# Patient Record
Sex: Female | Born: 1960
Health system: Southern US, Community
[De-identification: ages and names within clinical notes are randomized; demographics above are authoritative.]

## PROBLEM LIST (undated history)

## (undated) DIAGNOSIS — Z1211 Encounter for screening for malignant neoplasm of colon: Secondary | ICD-10-CM

## (undated) DIAGNOSIS — R011 Cardiac murmur, unspecified: Secondary | ICD-10-CM

## (undated) DIAGNOSIS — N6091 Unspecified benign mammary dysplasia of right breast: Secondary | ICD-10-CM

## (undated) DIAGNOSIS — N951 Menopausal and female climacteric states: Secondary | ICD-10-CM

## (undated) DIAGNOSIS — Z1371 Encounter for nonprocreative screening for genetic disease carrier status: Secondary | ICD-10-CM

## (undated) DIAGNOSIS — Z8041 Family history of malignant neoplasm of ovary: Secondary | ICD-10-CM

## (undated) HISTORY — DX: Menopausal and female climacteric states: N95.1

## (undated) HISTORY — DX: Encounter for screening for malignant neoplasm of colon: Z12.11

## (undated) HISTORY — DX: Encounter for nonprocreative screening for genetic disease carrier status: Z13.71

## (undated) HISTORY — DX: Family history of malignant neoplasm of ovary: Z80.41

## (undated) HISTORY — DX: Cardiac murmur, unspecified: R01.1

## (undated) HISTORY — DX: Unspecified benign mammary dysplasia of right breast: N60.91

---

## 2005-03-15 ENCOUNTER — Ambulatory Visit: Payer: Self-pay | Admitting: Unknown Physician Specialty

## 2006-03-12 ENCOUNTER — Ambulatory Visit: Payer: Self-pay | Admitting: Unknown Physician Specialty

## 2007-03-19 ENCOUNTER — Ambulatory Visit: Payer: Self-pay | Admitting: Unknown Physician Specialty

## 2009-04-08 ENCOUNTER — Ambulatory Visit: Payer: Self-pay | Admitting: Unknown Physician Specialty

## 2010-04-18 ENCOUNTER — Ambulatory Visit: Payer: Self-pay | Admitting: Unknown Physician Specialty

## 2011-04-25 ENCOUNTER — Ambulatory Visit: Payer: Self-pay | Admitting: Unknown Physician Specialty

## 2013-06-30 ENCOUNTER — Ambulatory Visit: Payer: Self-pay | Admitting: Obstetrics and Gynecology

## 2014-08-04 ENCOUNTER — Ambulatory Visit: Payer: Self-pay

## 2014-08-04 LAB — COMPREHENSIVE METABOLIC PANEL
Albumin: 4 g/dL (ref 3.4–5.0)
Alkaline Phosphatase: 85 U/L
Anion Gap: 7 (ref 7–16)
BUN: 15 mg/dL (ref 7–18)
Bilirubin,Total: 0.5 mg/dL (ref 0.2–1.0)
CHLORIDE: 106 mmol/L (ref 98–107)
Calcium, Total: 9.2 mg/dL (ref 8.5–10.1)
Co2: 28 mmol/L (ref 21–32)
Creatinine: 0.69 mg/dL (ref 0.60–1.30)
EGFR (African American): >60
EGFR (Non-African Amer.): >60
Glucose: 90 mg/dL (ref 65–99)
OSMOLALITY: 282 (ref 275–301)
Potassium: 3.6 mmol/L (ref 3.5–5.1)
SGOT(AST): 20 U/L (ref 15–37)
SGPT (ALT): 22 U/L
Sodium: 141 mmol/L (ref 136–145)
Total Protein: 7.8 g/dL (ref 6.4–8.2)

## 2014-08-04 LAB — LIPID PANEL
Cholesterol: 211 mg/dL — ABNORMAL HIGH (ref 0–200)
HDL Cholesterol: 83 mg/dL — ABNORMAL HIGH (ref 40–60)
Ldl Cholesterol, Calc: 110 mg/dL — ABNORMAL HIGH (ref 0–100)
Triglycerides: 91 mg/dL (ref 0–200)
VLDL CHOLESTEROL, CALC: 18 mg/dL (ref 5–40)

## 2014-08-20 DIAGNOSIS — Z1371 Encounter for nonprocreative screening for genetic disease carrier status: Secondary | ICD-10-CM

## 2014-08-20 HISTORY — DX: Encounter for nonprocreative screening for genetic disease carrier status: Z13.71

## 2014-08-20 HISTORY — PX: BREAST BIOPSY: SHX20

## 2015-07-05 ENCOUNTER — Other Ambulatory Visit: Payer: Self-pay | Admitting: Obstetrics and Gynecology

## 2015-07-05 DIAGNOSIS — Z1231 Encounter for screening mammogram for malignant neoplasm of breast: Secondary | ICD-10-CM

## 2015-07-12 ENCOUNTER — Ambulatory Visit
Admission: RE | Admit: 2015-07-12 | Discharge: 2015-07-12 | Disposition: A | Discharge status: Home / Self Care | Payer: 59 | Source: Ambulatory Visit | Attending: Obstetrics and Gynecology | Admitting: Obstetrics and Gynecology

## 2015-07-12 ENCOUNTER — Other Ambulatory Visit: Payer: Self-pay | Admitting: Obstetrics and Gynecology

## 2015-07-12 DIAGNOSIS — Z1231 Encounter for screening mammogram for malignant neoplasm of breast: Secondary | ICD-10-CM | POA: Insufficient documentation

## 2015-09-07 MED FILL — VENLAFAXINE HCL 75 MG TAB: 75 | 30 days supply | Qty: 30 | Fill #0

## 2015-10-13 MED FILL — VENLAFAXINE HCL 75 MG TAB: 75 | 30 days supply | Qty: 30 | Fill #1

## 2015-10-19 DIAGNOSIS — H5213 Myopia, bilateral: Secondary | ICD-10-CM | POA: Diagnosis not present

## 2015-11-15 MED FILL — VENLAFAXINE HCL 75 MG TAB: 75 | 30 days supply | Qty: 30 | Fill #2

## 2015-12-14 MED FILL — VENLAFAXINE HCL 75 MG TAB: 75 | 30 days supply | Qty: 30 | Fill #3

## 2016-01-09 MED FILL — VENLAFAXINE HCL 75 MG TAB: 75 | 30 days supply | Qty: 30 | Fill #4

## 2016-02-01 MED FILL — VENLAFAXINE HCL 75 MG TAB: 75 | 30 days supply | Qty: 30 | Fill #5

## 2016-03-15 MED FILL — VENLAFAXINE HCL 75 MG TAB: 75 | 30 days supply | Qty: 30 | Fill #6

## 2016-04-09 DIAGNOSIS — H6121 Impacted cerumen, right ear: Secondary | ICD-10-CM | POA: Diagnosis not present

## 2016-04-09 DIAGNOSIS — H6980 Other specified disorders of Eustachian tube, unspecified ear: Secondary | ICD-10-CM | POA: Diagnosis not present

## 2016-04-09 DIAGNOSIS — J301 Allergic rhinitis due to pollen: Secondary | ICD-10-CM | POA: Diagnosis not present

## 2016-04-09 DIAGNOSIS — H903 Sensorineural hearing loss, bilateral: Secondary | ICD-10-CM | POA: Diagnosis not present

## 2016-04-09 MED FILL — VENLAFAXINE HCL 75 MG TAB: 75 | 30 days supply | Qty: 30 | Fill #0

## 2016-04-09 MED FILL — FLUTICASONE PROP 50 MCG SPR: 50 | 30 days supply | Qty: 16 | Fill #0

## 2016-05-01 DIAGNOSIS — M722 Plantar fascial fibromatosis: Secondary | ICD-10-CM | POA: Diagnosis not present

## 2016-05-01 MED FILL — MELOXICAM 15 MG TABLET: 15 | 30 days supply | Qty: 30 | Fill #0

## 2016-05-10 MED FILL — VENLAFAXINE HCL 75 MG TAB: 75 | 30 days supply | Qty: 30 | Fill #1

## 2016-05-10 MED FILL — FLUTICASONE PROP 50 MCG SPR: 50 | 30 days supply | Qty: 16 | Fill #1

## 2016-06-04 ENCOUNTER — Encounter: Payer: Self-pay | Admitting: Family Medicine

## 2016-06-04 ENCOUNTER — Ambulatory Visit (INDEPENDENT_AMBULATORY_CARE_PROVIDER_SITE_OTHER): Payer: 59 | Admitting: Family Medicine

## 2016-06-04 VITALS — BP 128/82 | HR 84 | Temp 98.2°F | Resp 16 | Ht 63.0 in | Wt 153.0 lb

## 2016-06-04 DIAGNOSIS — J309 Allergic rhinitis, unspecified: Secondary | ICD-10-CM | POA: Insufficient documentation

## 2016-06-04 DIAGNOSIS — H6691 Otitis media, unspecified, right ear: Secondary | ICD-10-CM | POA: Diagnosis not present

## 2016-06-04 DIAGNOSIS — H6980 Other specified disorders of Eustachian tube, unspecified ear: Secondary | ICD-10-CM | POA: Insufficient documentation

## 2016-06-04 MED ORDER — DOXYCYCLINE HYCLATE 100 MG PO TABS: 100.0000 mg | ORAL_TABLET | Freq: Two times a day (BID) | ORAL | 1 refills | Status: DC

## 2016-06-04 NOTE — Progress Notes (Signed)
       Patient: Catherine Ramos Female    DOB: 05-22-1961   55 y.o.   MRN: [id number] Visit Date: 06/04/2016  Today's Provider: Wilhemena Durie, MD   Chief Complaint  Patient presents with  . URI   Subjective:    URI   This is a new problem. The current episode started in the past 7 days (x 5 days). The problem has been gradually worsening. There has been no fever. Associated symptoms include coughing (dry), ear pain (right ear. Discharge), a plugged ear sensation, sinus pain ("eyes are puffy") and sneezing. Pertinent negatives include no abdominal pain, chest pain, congestion, diarrhea, dysuria, headaches, joint pain, nausea, neck pain, rash, rhinorrhea, sore throat, swollen glands, vomiting or wheezing. Treatments tried: Alka Seltzer Cold Plus. The treatment provided mild relief.       Allergies  Allergen Reactions  . Diphenhydramine     tachycardia     Current Outpatient Prescriptions:  .  CALCIUM CARBONATE-VIT D-MIN PO, Take by mouth., Disp: , Rfl:  .  fexofenadine (ALLEGRA ALLERGY) 180 MG tablet, Take by mouth., Disp: , Rfl:  .  venlafaxine (EFFEXOR) 75 MG tablet, , Disp: , Rfl: 6  Review of Systems  HENT: Positive for ear pain (right ear. Discharge) and sneezing. Negative for congestion, rhinorrhea and sore throat.   Eyes: Negative.   Respiratory: Positive for cough (dry). Negative for wheezing.   Cardiovascular: Negative for chest pain.  Gastrointestinal: Negative for abdominal pain, diarrhea, nausea and vomiting.  Endocrine: Negative.   Genitourinary: Negative for dysuria.  Musculoskeletal: Negative for joint pain and neck pain.  Skin: Negative for rash.  Allergic/Immunologic: Negative.   Neurological: Negative for headaches.  Psychiatric/Behavioral: Negative.     Social History  Substance Use Topics  . Smoking status: Never Smoker  . Smokeless tobacco: Never Used  . Alcohol use Yes     Comment: rare   Objective:   BP 128/82 (BP Location: Left Arm,  Patient Position: Sitting, Cuff Size: Normal)   Pulse 84   Temp 98.2 F (36.8 C) (Oral)   Resp 16   Ht 5\' 3"  (1.6 m)   Wt 153 lb (69.4 kg)   BMI 27.10 kg/m   Physical Exam  Constitutional: She appears well-developed and well-nourished.  HENT:  Head: Normocephalic and atraumatic.  Mouth/Throat: Oropharynx is clear and moist.  Right TM dull, erythematous, bulging. Left TM dull.  Neck: Neck supple. No thyromegaly present.  Cardiovascular: Normal rate and regular rhythm.   Pulmonary/Chest: Effort normal and breath sounds normal. No respiratory distress.  Abdominal: Soft.  Lymphadenopathy:    She has no cervical adenopathy.  Skin: Skin is warm and dry.  Psychiatric: She has a normal mood and affect. Her behavior is normal. Thought content normal.        Assessment & Plan:     1. Right otitis media, unspecified otitis media type  - doxycycline (VIBRA-TABS) 100 MG tablet; Take 1 tablet (100 mg total) by mouth 2 (two) times daily.  Dispense: 14 tablet; Refill: 1 2.URI    I have done the exam and reviewed the above chart and it is accurate to the best of my knowledge.  Patient seen and examined by Miguel Aschoff, MD, and note scribed by Renaldo Fiddler, CMA.  Richard Cranford Mon, MD  [facility]

## 2016-06-11 MED FILL — MELOXICAM 15 MG TABLET: 15 | 30 days supply | Qty: 30 | Fill #1

## 2016-06-11 MED FILL — FLUTICASONE PROP 50 MCG SPR: 50 | 30 days supply | Qty: 16 | Fill #2

## 2016-06-12 DIAGNOSIS — M722 Plantar fascial fibromatosis: Secondary | ICD-10-CM | POA: Diagnosis not present

## 2016-06-13 MED FILL — DOXYCYCLINE HYC 100 MG CAP: 100 | 7 days supply | Qty: 14 | Fill #0

## 2016-06-21 MED FILL — VENLAFAXINE HCL 75 MG TAB: 75 | 30 days supply | Qty: 30 | Fill #2

## 2016-06-25 ENCOUNTER — Other Ambulatory Visit: Payer: Self-pay | Admitting: Family Medicine

## 2016-06-25 DIAGNOSIS — H6691 Otitis media, unspecified, right ear: Secondary | ICD-10-CM

## 2016-06-25 NOTE — Telephone Encounter (Signed)
Pt contacted office for refill request on the following medications: doxycycline (VIBRA-TABS) 100 MG tablet Last written: 06/04/16 Pt stated that the medication had helped but she is still coughing and not feeling well and would like another round sent to [facility]. Please advise. Thanks TNP

## 2016-06-25 NOTE — Telephone Encounter (Signed)
Please review-aa 

## 2016-06-26 ENCOUNTER — Other Ambulatory Visit: Payer: Self-pay

## 2016-06-26 DIAGNOSIS — H6691 Otitis media, unspecified, right ear: Secondary | ICD-10-CM

## 2016-06-26 MED ORDER — DOXYCYCLINE HYCLATE 100 MG PO TABS: 100.0000 mg | ORAL_TABLET | Freq: Two times a day (BID) | ORAL | 0 refills | Status: DC

## 2016-06-26 MED FILL — DOXYCYCLINE HYCLATE 100 MG: 100 | 7 days supply | Qty: 14 | Fill #0

## 2016-06-26 NOTE — Telephone Encounter (Signed)
RX sent in, pt advised-aa

## 2016-06-26 NOTE — Telephone Encounter (Signed)
Ok,most likely viral ,however.

## 2016-07-04 MED FILL — FLUTICASONE PROP 50 MCG SPR: 50 | 30 days supply | Qty: 16 | Fill #3

## 2016-07-05 ENCOUNTER — Encounter: Payer: Self-pay | Admitting: *Deleted

## 2016-07-05 DIAGNOSIS — Z1151 Encounter for screening for human papillomavirus (HPV): Secondary | ICD-10-CM | POA: Diagnosis not present

## 2016-07-05 DIAGNOSIS — Z124 Encounter for screening for malignant neoplasm of cervix: Secondary | ICD-10-CM | POA: Diagnosis not present

## 2016-07-05 DIAGNOSIS — Z1239 Encounter for other screening for malignant neoplasm of breast: Secondary | ICD-10-CM | POA: Diagnosis not present

## 2016-07-05 DIAGNOSIS — Z8041 Family history of malignant neoplasm of ovary: Secondary | ICD-10-CM | POA: Diagnosis not present

## 2016-07-05 DIAGNOSIS — Z1211 Encounter for screening for malignant neoplasm of colon: Secondary | ICD-10-CM | POA: Diagnosis not present

## 2016-07-05 DIAGNOSIS — N951 Menopausal and female climacteric states: Secondary | ICD-10-CM | POA: Diagnosis not present

## 2016-07-05 DIAGNOSIS — Z01419 Encounter for gynecological examination (general) (routine) without abnormal findings: Secondary | ICD-10-CM | POA: Diagnosis not present

## 2016-07-05 DIAGNOSIS — Z0000 Encounter for general adult medical examination without abnormal findings: Secondary | ICD-10-CM | POA: Diagnosis not present

## 2016-07-05 LAB — HM PAP SMEAR

## 2016-07-10 ENCOUNTER — Ambulatory Visit (INDEPENDENT_AMBULATORY_CARE_PROVIDER_SITE_OTHER): Payer: 59 | Admitting: Physician Assistant

## 2016-07-10 ENCOUNTER — Encounter: Payer: Self-pay | Admitting: Physician Assistant

## 2016-07-10 VITALS — BP 122/76 | HR 78 | Temp 98.0°F | Resp 16 | Wt 152.0 lb

## 2016-07-10 DIAGNOSIS — H9211 Otorrhea, right ear: Secondary | ICD-10-CM

## 2016-07-10 NOTE — Progress Notes (Signed)
Patient: Catherine Ramos Female    DOB: November 04, 1960   55 y.o.   MRN: [id number] Visit Date: 07/10/2016  Today's Provider: Trinna Post, PA-C   Chief Complaint  Patient presents with  . Ear Drainage   Subjective:    HPI Patient is a 55 y/o female with history of allergies and right otitis media on 06/04/16 treated with two rounds of doxycycline who is here for ear drainage out of her right ear, yellowish in color. She reports that this has been occurring since 06/04/16 and got better with first round of doxy but then started draining again. So she was called in another antibiotics and it is still bothering her. She denies any ear pain, just that it feels full. She is taking Flonase. No fever, nausea, vomiting.    Allergies  Allergen Reactions  . Diphenhydramine     tachycardia     Current Outpatient Prescriptions:  .  CALCIUM CARBONATE-VIT D-MIN PO, Take by mouth., Disp: , Rfl:  .  fexofenadine (ALLEGRA ALLERGY) 180 MG tablet, Take by mouth., Disp: , Rfl:  .  fluticasone (FLONASE) 50 MCG/ACT nasal spray, , Disp: , Rfl: 12 .  venlafaxine (EFFEXOR) 75 MG tablet, , Disp: , Rfl: 6 .  doxycycline (VIBRA-TABS) 100 MG tablet, Take 1 tablet (100 mg total) by mouth 2 (two) times daily. (Patient not taking: Reported on 07/10/2016), Disp: 14 tablet, Rfl: 0  Review of Systems  Constitutional: Negative.   HENT: Positive for ear discharge.   Eyes: Negative.   Respiratory: Negative.   Cardiovascular: Negative.   Gastrointestinal: Negative.   Endocrine: Negative.   Genitourinary: Negative.   Musculoskeletal: Negative.   Skin: Negative.   Allergic/Immunologic: Negative.   Neurological: Negative.   Hematological: Negative.   Psychiatric/Behavioral: Negative.     Social History  Substance Use Topics  . Smoking status: Never Smoker  . Smokeless tobacco: Never Used  . Alcohol use Yes     Comment: rare   Objective:   BP 122/76 (BP Location: Left Arm, Patient Position:  Sitting, Cuff Size: Normal)   Pulse 78   Temp 98 F (36.7 C) (Oral)   Resp 16   Wt 152 lb (68.9 kg)   BMI 26.93 kg/m   Physical Exam  Constitutional: She appears well-developed and well-nourished. No distress.  HENT:  Right Ear: No drainage. Tympanic membrane is not injected, not perforated, not erythematous and not bulging. No hemotympanum. Decreased hearing is noted.  Left Ear: No drainage. Tympanic membrane is not injected, not perforated, not erythematous and not bulging. No hemotympanum. No decreased hearing is noted.  Mouth/Throat: Oropharynx is clear and moist. No oropharyngeal exudate.  There is a dry patch of skin in the right helix that is not weeping, crusted or bleeding.  Lymphadenopathy:    She has no cervical adenopathy.  Skin: She is not diaphoretic.        Assessment & Plan:      Problem List Items Addressed This Visit    None    Visit Diagnoses    Drainage from ear, right    -  Primary     Patient is a 55 year old female presenting with right ear drainage. I do not visualize any drainage in her ear canal today. Tympanic membrane is intact, not erythematous, not bulging. Advised patient to continue to use Flonase and Zyrtec. May use swimmers ear drops to dry ear. May use techniques in office to clear pressure in ear.  Counseled that fluid accumulation should resolve in time.   The entirety of the information documented in the History of Present Illness, Review of Systems and Physical Exam were personally obtained by me. Portions of this information were initially documented by [place] and reviewed by me for thoroughness and accuracy.   Patient Instructions  Ear Drainage Introduction Ear drainage means that ear wax, pus, blood, or other fluid comes out of the ear (discharge). Follow these instructions at home: Pay attention to any changes in your ear drainage. Take these actions to help with your condition:  Take over-the-counter and prescription  medicines only as told by your doctor.  Do not use cotton-tipped swabs in your ear. Do not put any other objects in your ear.  Do not swim until your doctor says it is okay.  Before you shower, cover a cotton ball with petroleum jelly and put that in your ear. This helps to keep water out of your ear.  Avoid being around smoke.  Wash your hands before and after you touch your ears.  Keep all follow-up visits as told by your doctor. This is important. Contact a doctor if:  You have more drainage.  You have ear pain.  You have a fever.  Your drainage is not getting better with treatment.  Your ear drainage is bloody, white, clear, or yellow.  Your ear is red or swollen. Get help right away if:  You have very bad ear pain.  You have a very bad headache.  You throw up (vomit).  You feel dizzy.  You have a seizure.  You have new hearing loss. This information is not intended to replace advice given to you by your health care provider. Make sure you discuss any questions you have with your health care provider. Document Released: 01/24/2010 Document Revised: 01/12/2016 Document Reviewed: 11/09/2014  2017 Elsevier   Return if symptoms worsen or fail to improve.        Trinna Post, PA-C  [facility]

## 2016-07-10 NOTE — Patient Instructions (Signed)
Ear Drainage Introduction Ear drainage means that ear wax, pus, blood, or other fluid comes out of the ear (discharge). Follow these instructions at home: Pay attention to any changes in your ear drainage. Take these actions to help with your condition:  Take over-the-counter and prescription medicines only as told by your doctor.  Do not use cotton-tipped swabs in your ear. Do not put any other objects in your ear.  Do not swim until your doctor says it is okay.  Before you shower, cover a cotton ball with petroleum jelly and put that in your ear. This helps to keep water out of your ear.  Avoid being around smoke.  Wash your hands before and after you touch your ears.  Keep all follow-up visits as told by your doctor. This is important. Contact a doctor if:  You have more drainage.  You have ear pain.  You have a fever.  Your drainage is not getting better with treatment.  Your ear drainage is bloody, white, clear, or yellow.  Your ear is red or swollen. Get help right away if:  You have very bad ear pain.  You have a very bad headache.  You throw up (vomit).  You feel dizzy.  You have a seizure.  You have new hearing loss. This information is not intended to replace advice given to you by your health care provider. Make sure you discuss any questions you have with your health care provider. Document Released: 01/24/2010 Document Revised: 01/12/2016 Document Reviewed: 11/09/2014  2017 Elsevier

## 2016-07-18 ENCOUNTER — Ambulatory Visit: Payer: Self-pay | Admitting: General Surgery

## 2016-07-19 ENCOUNTER — Other Ambulatory Visit: Payer: Self-pay | Admitting: Obstetrics and Gynecology

## 2016-07-19 DIAGNOSIS — Z1231 Encounter for screening mammogram for malignant neoplasm of breast: Secondary | ICD-10-CM

## 2016-07-30 ENCOUNTER — Telehealth: Payer: Self-pay | Admitting: Physician Assistant

## 2016-07-30 NOTE — Telephone Encounter (Signed)
Pt stated she had finish medication as advised and done OTC as directed. Pt stated her ear is back to how it was in October and would like to be advised what to try next. [place]. Pt stated to call on work # or if after 3 pm call cell#. Please advise. Thanks TNP

## 2016-07-31 NOTE — Telephone Encounter (Signed)
Pt called back to update her new work # and to request a call on her work #. Please advise. Thanks TNP

## 2016-07-31 NOTE — Telephone Encounter (Signed)
LMTCB  07/31/2016  Thanks,   -Mickel Baas

## 2016-07-31 NOTE — Telephone Encounter (Signed)
Please review and let Rhys Martini call her back :)-aa

## 2016-07-31 NOTE — Telephone Encounter (Signed)
Last time I saw patient her ear looked normal so I do not know what is bothering her or what else to suggest. She is already on allergy medications and flonase. She can have referral to ENT if she wishes.

## 2016-07-31 NOTE — Telephone Encounter (Signed)
Pt called back. Please advise.

## 2016-07-31 NOTE — Telephone Encounter (Signed)
FYI...   Pt advised.Marland KitchenMarland KitchenShe reports that she has already been to the ENT.  She has the same symptoms now as when she saw Dr. Rosanna Randy in October.  I advised her she would need to come in to see if there is an infection.  She agreed.  Appointment made for 08/02/2016 at 4pm.  Per pt's request.   Thanks,   -Mickel Baas

## 2016-08-02 ENCOUNTER — Encounter: Payer: Self-pay | Admitting: Physician Assistant

## 2016-08-02 ENCOUNTER — Ambulatory Visit (INDEPENDENT_AMBULATORY_CARE_PROVIDER_SITE_OTHER): Payer: 59 | Admitting: Physician Assistant

## 2016-08-02 VITALS — BP 118/76 | HR 88 | Temp 98.4°F | Resp 16 | Wt 152.0 lb

## 2016-08-02 DIAGNOSIS — H9211 Otorrhea, right ear: Secondary | ICD-10-CM | POA: Diagnosis not present

## 2016-08-02 MED ORDER — AMOXICILLIN 500 MG PO CAPS: 1000.0000 mg | ORAL_CAPSULE | Freq: Two times a day (BID) | ORAL | 0 refills | Status: AC

## 2016-08-02 NOTE — Progress Notes (Signed)
Patient: Catherine Ramos Female    DOB: March 03, 1961   55 y.o.   MRN: [id number] Visit Date: 08/02/2016  Today's Provider: Trinna Post, PA-C   Chief Complaint  Patient presents with  . Ear Drainage    Right ear.   Subjective:    Ear Drainage   There is pain in the right ear. This is a recurrent problem. The current episode started more than 1 month ago. The problem has been waxing and waning. There has been no fever. Associated symptoms include ear discharge. Pertinent negatives include no headaches, hearing loss, rhinorrhea or sore throat. She has tried antibiotics and ear drops for the symptoms. The treatment provided no relief. There is no history of hearing loss.   Patient is a 55 y/o female with history of allergies presenting today with right ear issues. Problems with her ear began two months ago when she felt she had hearing loss. She visited Dr. Richardson Landry at [facility] who cleaned out her ear and conducted hearing tests, which were normal.  Next, she presented to this office with right ear drainage and was prescribed two rounds of doxycycline which were ineffective. She was seen by myself three weeks ago for continued drainage, and directed on auto-insufflation techniques and to continue her Allegra and flonase nasal spray.  She presents again today for painless right ear drainage, which has worsened in the past few days. She has tried swimmers ear drops without relief. No ear pain or fullness, nausea or vomiting. She does not have any other upper respiratory symptoms.  Allergies  Allergen Reactions  . Diphenhydramine     tachycardia     Current Outpatient Prescriptions:  .  CALCIUM CARBONATE-VIT D-MIN PO, Take by mouth., Disp: , Rfl:  .  fexofenadine (ALLEGRA ALLERGY) 180 MG tablet, Take by mouth., Disp: , Rfl:  .  venlafaxine (EFFEXOR) 75 MG tablet, , Disp: , Rfl: 6 .  amoxicillin (AMOXIL) 500 MG capsule, Take 2 capsules (1,000 mg total) by mouth 2 (two) times  daily., Disp: 28 capsule, Rfl: 0 .  fluticasone (FLONASE) 50 MCG/ACT nasal spray, , Disp: , Rfl: 12  Review of Systems  HENT: Positive for congestion, ear discharge, sinus pressure and tinnitus. Negative for ear pain, hearing loss, nosebleeds, postnasal drip, rhinorrhea, sinus pain, sneezing and sore throat.   Eyes: Positive for discharge. Negative for photophobia, pain, redness, itching and visual disturbance.  Respiratory: Negative.   Cardiovascular: Negative.   Gastrointestinal: Negative.   Neurological: Negative for dizziness, light-headedness and headaches.    Social History  Substance Use Topics  . Smoking status: Never Smoker  . Smokeless tobacco: Never Used  . Alcohol use Yes     Comment: rare   Objective:   BP 118/76 (BP Location: Right Arm, Patient Position: Sitting, Cuff Size: Normal)   Pulse 88   Temp 98.4 F (36.9 C) (Oral)   Resp 16   Wt 152 lb (68.9 kg)   BMI 26.93 kg/m   Physical Exam  Constitutional: She appears well-developed and well-nourished. No distress.  HENT:  Right Ear: External ear normal. No drainage. Tympanic membrane is not injected, not erythematous, not retracted and not bulging. No middle ear effusion.  Left Ear: Tympanic membrane, external ear and ear canal normal. No drainage. Tympanic membrane is not injected, not erythematous, not retracted and not bulging.  No middle ear effusion.  There is a collection of white deposits on her right TM that obstruct visualization of the  ossicles.  Eyes: Conjunctivae are normal. Right eye exhibits no discharge. Left eye exhibits no discharge.  Lymphadenopathy:    She has no cervical adenopathy.  Skin: She is not diaphoretic.        Assessment & Plan:      Problem List Items Addressed This Visit    None    Visit Diagnoses    Drainage from right ear    -  Primary   Relevant Medications   amoxicillin (AMOXIL) 500 MG capsule     Patient is 55 y/o female presenting with persistent right ear  drainage. 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 Patient is reluctant to return to ENT, and wishes to try another round of antibiotics before doing so. I have advised her that I do not think they will be helpful to her, but if she is accepting of the risks, she may try them. If they fail, she should follow up with ENT. Patient agrees. Will treat with amoxicillin 1000 mg BID x 7 days.  Return if symptoms worsen or fail to improve.  The entirety of the information documented in the History of Present Illness, Review of Systems and Physical Exam were personally obtained by me. Portions of this information were initially documented by Ashley Royalty, CMA and reviewed by me for thoroughness and accuracy.   Patient Instructions  Ear Drainage Introduction Ear drainage means that ear wax, pus, blood, or other fluid comes out of the ear (discharge). Follow these instructions at home: Pay attention to any changes in your ear drainage. Take these actions to help with your condition:  Take over-the-counter and prescription medicines only as told by your doctor.  Do not use cotton-tipped swabs in your ear. Do not put any other objects in your ear.  Do not swim until your doctor says it is okay.  Before you shower, cover a cotton ball with petroleum jelly and put that in your ear. This helps to keep water out of your ear.  Avoid being around smoke.  Wash your hands before and after you touch your ears.  Keep all follow-up visits as told by your doctor. This is important. Contact a doctor if:  You have more drainage.  You have ear pain.  You have a fever.  Your drainage is not getting better with treatment.  Your ear drainage is bloody, white, clear, or yellow.  Your ear is red or swollen. Get help right away if:  You have very bad ear pain.  You have a very bad headache.  You throw up (vomit).  You feel dizzy.  You have a seizure.  You have new hearing  loss. This information is not intended to replace advice given to you by your health care provider. Make sure you discuss any questions you have with your health care provider. Document Released: 01/24/2010 Document Revised: 01/12/2016 Document Reviewed: 11/09/2014  2017 [place], PA-C  [facility]

## 2016-08-02 NOTE — Patient Instructions (Signed)
Ear Drainage Introduction Ear drainage means that ear wax, pus, blood, or other fluid comes out of the ear (discharge). Follow these instructions at home: Pay attention to any changes in your ear drainage. Take these actions to help with your condition:  Take over-the-counter and prescription medicines only as told by your doctor.  Do not use cotton-tipped swabs in your ear. Do not put any other objects in your ear.  Do not swim until your doctor says it is okay.  Before you shower, cover a cotton ball with petroleum jelly and put that in your ear. This helps to keep water out of your ear.  Avoid being around smoke.  Wash your hands before and after you touch your ears.  Keep all follow-up visits as told by your doctor. This is important. Contact a doctor if:  You have more drainage.  You have ear pain.  You have a fever.  Your drainage is not getting better with treatment.  Your ear drainage is bloody, white, clear, or yellow.  Your ear is red or swollen. Get help right away if:  You have very bad ear pain.  You have a very bad headache.  You throw up (vomit).  You feel dizzy.  You have a seizure.  You have new hearing loss. This information is not intended to replace advice given to you by your health care provider. Make sure you discuss any questions you have with your health care provider. Document Released: 01/24/2010 Document Revised: 01/12/2016 Document Reviewed: 11/09/2014  2017 Elsevier

## 2016-08-06 MED FILL — VENLAFAXINE HCL 75 MG TAB: 75 | 30 days supply | Qty: 30 | Fill #3

## 2016-08-06 MED FILL — FLUTICASONE PROP 50 MCG SPR: 50 | 30 days supply | Qty: 16 | Fill #4

## 2016-08-16 ENCOUNTER — Encounter: Payer: Self-pay | Admitting: *Deleted

## 2016-08-20 DIAGNOSIS — N6091 Unspecified benign mammary dysplasia of right breast: Secondary | ICD-10-CM

## 2016-08-20 HISTORY — DX: Unspecified benign mammary dysplasia of right breast: N60.91

## 2016-08-29 ENCOUNTER — Ambulatory Visit
Admission: RE | Admit: 2016-08-29 | Discharge: 2016-08-29 | Disposition: A | Discharge status: Home / Self Care | Payer: 59 | Source: Ambulatory Visit | Attending: Obstetrics and Gynecology | Admitting: Obstetrics and Gynecology

## 2016-08-29 DIAGNOSIS — Z1231 Encounter for screening mammogram for malignant neoplasm of breast: Secondary | ICD-10-CM | POA: Diagnosis not present

## 2016-09-03 ENCOUNTER — Other Ambulatory Visit: Payer: Self-pay | Admitting: Obstetrics and Gynecology

## 2016-09-03 DIAGNOSIS — R921 Mammographic calcification found on diagnostic imaging of breast: Secondary | ICD-10-CM

## 2016-09-03 DIAGNOSIS — R928 Other abnormal and inconclusive findings on diagnostic imaging of breast: Secondary | ICD-10-CM

## 2016-09-12 MED FILL — FLUTICASONE PROP 50 MCG SPR: 50 | 30 days supply | Qty: 16 | Fill #5

## 2016-09-13 MED FILL — VENLAFAXINE HCL 75 MG TAB: 75 | 30 days supply | Qty: 30 | Fill #0

## 2016-09-14 ENCOUNTER — Ambulatory Visit
Admission: RE | Admit: 2016-09-14 | Discharge: 2016-09-14 | Disposition: A | Discharge status: Home / Self Care | Payer: 59 | Source: Ambulatory Visit | Attending: Obstetrics and Gynecology | Admitting: Obstetrics and Gynecology

## 2016-09-14 DIAGNOSIS — R921 Mammographic calcification found on diagnostic imaging of breast: Secondary | ICD-10-CM | POA: Insufficient documentation

## 2016-09-14 DIAGNOSIS — R928 Other abnormal and inconclusive findings on diagnostic imaging of breast: Secondary | ICD-10-CM

## 2016-09-28 ENCOUNTER — Other Ambulatory Visit: Payer: Self-pay | Admitting: Obstetrics and Gynecology

## 2016-09-28 DIAGNOSIS — R921 Mammographic calcification found on diagnostic imaging of breast: Secondary | ICD-10-CM

## 2016-10-22 MED FILL — VENLAFAXINE HCL 75 MG TAB: 75 | 30 days supply | Qty: 30 | Fill #1

## 2016-10-23 DIAGNOSIS — D229 Melanocytic nevi, unspecified: Secondary | ICD-10-CM | POA: Diagnosis not present

## 2016-10-23 DIAGNOSIS — L570 Actinic keratosis: Secondary | ICD-10-CM | POA: Diagnosis not present

## 2016-10-23 DIAGNOSIS — D225 Melanocytic nevi of trunk: Secondary | ICD-10-CM | POA: Diagnosis not present

## 2016-10-23 DIAGNOSIS — L814 Other melanin hyperpigmentation: Secondary | ICD-10-CM | POA: Diagnosis not present

## 2016-10-23 DIAGNOSIS — D485 Neoplasm of uncertain behavior of skin: Secondary | ICD-10-CM | POA: Diagnosis not present

## 2016-11-20 MED FILL — VENLAFAXINE HCL 75 MG TAB: 75 | 30 days supply | Qty: 30 | Fill #2

## 2016-12-20 MED FILL — VENLAFAXINE HCL 75 MG TAB: 75 | 30 days supply | Qty: 30 | Fill #3

## 2017-01-21 ENCOUNTER — Other Ambulatory Visit: Payer: Self-pay | Admitting: Obstetrics and Gynecology

## 2017-01-21 MED FILL — VENLAFAXINE HCL 75 MG TAB: 75 | 30 days supply | Qty: 30 | Fill #0

## 2017-02-19 MED FILL — VENLAFAXINE HCL 75 MG TAB: 75 | 30 days supply | Qty: 30 | Fill #1

## 2017-03-18 ENCOUNTER — Ambulatory Visit
Admission: RE | Admit: 2017-03-18 | Discharge: 2017-03-18 | Disposition: A | Discharge status: Home / Self Care | Payer: 59 | Source: Ambulatory Visit | Attending: Obstetrics and Gynecology | Admitting: Obstetrics and Gynecology

## 2017-03-18 ENCOUNTER — Other Ambulatory Visit: Payer: Self-pay | Admitting: Obstetrics and Gynecology

## 2017-03-18 DIAGNOSIS — R921 Mammographic calcification found on diagnostic imaging of breast: Secondary | ICD-10-CM

## 2017-03-18 DIAGNOSIS — R928 Other abnormal and inconclusive findings on diagnostic imaging of breast: Secondary | ICD-10-CM

## 2017-03-19 ENCOUNTER — Telehealth: Payer: Self-pay | Admitting: Obstetrics and Gynecology

## 2017-03-19 MED FILL — VENLAFAXINE HCL 75 MG TAB: 75 | 30 days supply | Qty: 30 | Fill #2

## 2017-03-19 NOTE — Telephone Encounter (Signed)
LM for pt acknowledging Cat 4 mammo and bx recommendation. Asked pt to call me back if she has any questions/wants to discuss further.

## 2017-03-27 ENCOUNTER — Ambulatory Visit
Admission: RE | Admit: 2017-03-27 | Discharge: 2017-03-27 | Disposition: A | Discharge status: Home / Self Care | Payer: 59 | Source: Ambulatory Visit | Attending: Obstetrics and Gynecology | Admitting: Obstetrics and Gynecology

## 2017-03-27 DIAGNOSIS — R921 Mammographic calcification found on diagnostic imaging of breast: Secondary | ICD-10-CM | POA: Diagnosis not present

## 2017-03-27 DIAGNOSIS — N6091 Unspecified benign mammary dysplasia of right breast: Secondary | ICD-10-CM | POA: Diagnosis not present

## 2017-03-27 DIAGNOSIS — R928 Other abnormal and inconclusive findings on diagnostic imaging of breast: Secondary | ICD-10-CM

## 2017-03-27 DIAGNOSIS — N62 Hypertrophy of breast: Secondary | ICD-10-CM | POA: Diagnosis not present

## 2017-03-27 DIAGNOSIS — R920 Mammographic microcalcification found on diagnostic imaging of breast: Secondary | ICD-10-CM | POA: Diagnosis not present

## 2017-03-27 HISTORY — PX: BREAST BIOPSY: SHX20

## 2017-03-28 LAB — SURGICAL PATHOLOGY

## 2017-03-29 ENCOUNTER — Telehealth: Payer: Self-pay

## 2017-03-29 DIAGNOSIS — N6099 Unspecified benign mammary dysplasia of unspecified breast: Secondary | ICD-10-CM

## 2017-03-29 NOTE — Telephone Encounter (Signed)
Catherine Ramos from Dr. 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 New national recommendations are to watch the area but many patients still want to be referred to surgeons to have it removed. Catherine Ramos stated that you like to call your own patients with these results. Just wanted to let you know. They are notifying her that she does not have cancer and that you will follow up with her regarding next steps. If you have questions for Catherine Ramos she can be reached at [phone]. Thank you.

## 2017-03-29 NOTE — Telephone Encounter (Signed)
LM for pt with results. Offered gen surg ref vs watch and wait. Pt to notify me if wants ref.

## 2017-04-01 DIAGNOSIS — N6099 Unspecified benign mammary dysplasia of unspecified breast: Secondary | ICD-10-CM | POA: Insufficient documentation

## 2017-04-01 NOTE — Telephone Encounter (Signed)
Discussed bx results with pt and recommendation for surg ref for 2nd opinion. She will think about it and would be interested in doing appt in a few months. She prefers to call me back to schedule gen surg appt.

## 2017-04-01 NOTE — Telephone Encounter (Signed)
[place] again.

## 2017-04-18 MED FILL — VENLAFAXINE HCL 75 MG TAB: 75 | 30 days supply | Qty: 30 | Fill #3

## 2017-04-23 ENCOUNTER — Encounter: Payer: Self-pay | Admitting: Family Medicine

## 2017-05-20 ENCOUNTER — Other Ambulatory Visit: Payer: Self-pay | Admitting: Obstetrics and Gynecology

## 2017-05-20 MED FILL — VENLAFAXINE HCL 75 MG TAB: 75 | 30 days supply | Qty: 30 | Fill #0

## 2017-05-20 NOTE — Telephone Encounter (Signed)
Please advise for refill. Thank you.  

## 2017-06-18 ENCOUNTER — Other Ambulatory Visit: Payer: Self-pay | Admitting: Obstetrics and Gynecology

## 2017-06-18 MED FILL — VENLAFAXINE HCL 75 MG TAB: 75 | 30 days supply | Qty: 30 | Fill #0

## 2017-07-08 ENCOUNTER — Encounter: Payer: Self-pay | Admitting: Obstetrics and Gynecology

## 2017-07-08 ENCOUNTER — Ambulatory Visit (INDEPENDENT_AMBULATORY_CARE_PROVIDER_SITE_OTHER): Payer: 59 | Admitting: Obstetrics and Gynecology

## 2017-07-08 ENCOUNTER — Other Ambulatory Visit: Payer: Self-pay

## 2017-07-08 VITALS — BP 128/72 | HR 76 | Ht 63.0 in | Wt 149.0 lb

## 2017-07-08 DIAGNOSIS — Z1239 Encounter for other screening for malignant neoplasm of breast: Secondary | ICD-10-CM

## 2017-07-08 DIAGNOSIS — Z13220 Encounter for screening for lipoid disorders: Secondary | ICD-10-CM

## 2017-07-08 DIAGNOSIS — Z01419 Encounter for gynecological examination (general) (routine) without abnormal findings: Secondary | ICD-10-CM

## 2017-07-08 DIAGNOSIS — N951 Menopausal and female climacteric states: Secondary | ICD-10-CM

## 2017-07-08 DIAGNOSIS — Z1231 Encounter for screening mammogram for malignant neoplasm of breast: Secondary | ICD-10-CM | POA: Diagnosis not present

## 2017-07-08 DIAGNOSIS — Z1211 Encounter for screening for malignant neoplasm of colon: Secondary | ICD-10-CM

## 2017-07-08 DIAGNOSIS — Z8041 Family history of malignant neoplasm of ovary: Secondary | ICD-10-CM

## 2017-07-08 DIAGNOSIS — Z0000 Encounter for general adult medical examination without abnormal findings: Secondary | ICD-10-CM

## 2017-07-08 LAB — HEMOCCULT GUIAC POC 1CARD (OFFICE): FECAL OCCULT BLD: NEGATIVE

## 2017-07-08 MED ORDER — VENLAFAXINE HCL 75 MG PO TABS: 75.0000 mg | ORAL_TABLET | Freq: Every day | ORAL | 5 refills | Status: DC

## 2017-07-08 NOTE — Progress Notes (Signed)
PCP: Jerrol Banana., MD   Chief Complaint  Patient presents with  . Gynecologic Exam    No complaints    HPI:      Ms. Catherine Ramos is a 56 y.o. G0P0000 who LMP was No LMP recorded. Patient is postmenopausal., presents today for her annual examination.  Her menses are absent due to menopause.  She does not have intermenstrual bleeding.  She does have vasomotor sx and uses effexor episodically with sx relief. Winter months are better than hotter months.   Sex activity: single partner, contraception - post menopausal status. She does not have vaginal dryness.  Last Pap: July 05, 2016  Results were: no abnormalities /neg HPV DNA.  Hx of STDs: none  Last mammogram: March 18, 2017  Results were: cat 4 RT breast. Pt had bx that showed ATYPICAL LOBULAR HYPERPLASIA (ALH) ASSOCIATED WITH A FEW VERY SMALL CALCIFICATIONS, SEE COMMENT. COLUMNAR CELL CHANGE ASSOCIATED WITH CALCIFICATIONS. NEGATIVE FOR CARCINOMA. Pt offered 2nd opinion with general surgery but declined.   There is no FH of breast cancer. There is a FH of ovarian cancer in her PGM. Pt is MyRisk neg 2016. The patient does do self-breast exams.  Colonoscopy: Never. Pt interested in cologard.   Tobacco use: The patient denies current or previous tobacco use. Alcohol use: none Exercise: moderately active  She does get adequate calcium and Vitamin D in her diet.  Due for fasting labs.   Past Medical History:  Diagnosis Date  . 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 Vasomotor symptoms due to menopause     Past Surgical History:  Procedure Laterality Date  . BREAST BIOPSY Left   . BREAST BIOPSY Right 03/27/2017   stereo bx path pending    Family History  Problem Relation Age of Onset  . Ovarian cancer Paternal Grandmother 32    Social History   Socioeconomic History  . Marital status: Married    Spouse name: Not on file  . Number of children: Not on file  . Years of  education: Not on file  . Highest education level: Not on file  Social Needs  . Financial resource strain: Not on file  . Food insecurity - worry: Not on file  . Food insecurity - inability: Not on file  . Transportation needs - medical: Not on file  . Transportation needs - non-medical: Not on file  Occupational History  . Occupation: Dentist: [employer]  Tobacco Use  . Smoking status: Never Smoker  . Smokeless tobacco: Never Used  Substance and Sexual Activity  . Alcohol use: No    Frequency: Never    Comment: rare  . Drug use: No  . Sexual activity: Yes  Other Topics Concern  . Not on file  Social History Narrative  . Not on file    Current Meds  Medication Sig  . CALCIUM CARBONATE-VIT D-MIN PO Take by mouth.  . fexofenadine (ALLEGRA ALLERGY) 180 MG tablet Take by mouth.  . fluticasone (FLONASE) 50 MCG/ACT nasal spray   . venlafaxine (EFFEXOR) 75 MG tablet Take 1 tablet (75 mg total) daily by mouth.  . [DISCONTINUED] venlafaxine (EFFEXOR) 75 MG tablet TAKE 1 TABLET BY MOUTH ONCE DAILY      ROS:  Review of Systems  Constitutional: Negative for fatigue, fever and unexpected weight change.  Respiratory: Negative for cough, shortness of breath and wheezing.   Cardiovascular: Negative for chest pain, palpitations and leg  swelling.  Gastrointestinal: Negative for blood in stool, constipation, diarrhea, nausea and vomiting.  Endocrine: Negative for cold intolerance, heat intolerance and polyuria.  Genitourinary: Negative for dyspareunia, dysuria, flank pain, frequency, genital sores, hematuria, menstrual problem, pelvic pain, urgency, vaginal bleeding, vaginal discharge and vaginal pain.  Musculoskeletal: Negative for back pain, joint swelling and myalgias.  Skin: Negative for rash.  Neurological: Negative for dizziness, syncope, light-headedness, numbness and headaches.  Hematological: Negative for adenopathy.  Psychiatric/Behavioral: Negative for  agitation, confusion, sleep disturbance and suicidal ideas. The patient is not nervous/anxious.      Objective: BP 128/72 (BP Location: Left Arm, Patient Position: Sitting, Cuff Size: Normal)   Pulse 76   Ht 5\' 3"  (1.6 m)   Wt 149 lb (67.6 kg)   BMI 26.39 kg/m    Physical Exam  Constitutional: She is oriented to person, place, and time. She appears well-developed and well-nourished.  Genitourinary: Rectum normal, vagina normal and uterus normal. There is no rash or tenderness on the right labia. There is no rash or tenderness on the left labia. No erythema or tenderness in the vagina. No vaginal discharge found. Right adnexum does not display mass and does not display tenderness. Left adnexum does not display mass and does not display tenderness. Cervix does not exhibit motion tenderness or polyp. Uterus is not enlarged or tender. Rectal exam shows no fissure, no mass, no tenderness and guaiac negative stool.  Neck: Normal range of motion. No thyromegaly present.  Cardiovascular: Normal rate, regular rhythm and normal heart sounds.  No murmur heard. Pulmonary/Chest: Effort normal and breath sounds normal. Right breast exhibits no mass, no nipple discharge, no skin change and no tenderness. Left breast exhibits no mass, no nipple discharge, no skin change and no tenderness.  Abdominal: Soft. There is no tenderness. There is no guarding.  Musculoskeletal: Normal range of motion.  Neurological: She is alert and oriented to person, place, and time. No cranial nerve deficit.  Psychiatric: She has a normal mood and affect. Her behavior is normal.  Vitals reviewed.   Results: Results for orders placed or performed in visit on 07/08/17 (from the past 24 hour(s))  POCT Occult Blood Stool     Status: Normal   Collection Time: 07/08/17  3:14 PM  Result Value Ref Range   Fecal Occult Blood, POC Negative Negative   Card #1 Date     Card #2 Fecal Occult Blod, POC     Card #2 Date     Card #3  Fecal Occult Blood, POC     Card #3 Date      Assessment/Plan:  Encounter for annual routine gynecological examination  Screening for breast cancer - Pt declines gen surg 2nd opinion re: breast bx. Repeat mammo due 7/19. - Plan: MM DIGITAL SCREENING BILATERAL  Screening for colon cancer - Neg FOBT. Declines colonoscopy. Pt would like to look into cologard. Will f/u for ref after contacting insurance co. - Plan: POCT Occult Blood Stool  Blood tests for routine general physical examination - Plan: Comprehensive metabolic panel, Lipid panel  Screening cholesterol level - Plan: Lipid panel  Vasomotor symptoms due to menopause - Rx RF effexor. Pt takes prn sx.  - Plan: venlafaxine (EFFEXOR) 75 MG tablet  Family history of ovarian cancer - Pt is MyRisk neg. NO further screening/testing indicated.   Meds ordered this encounter  Medications  . venlafaxine (EFFEXOR) 75 MG tablet    Sig: Take 1 tablet (75 mg total) daily by mouth.  Dispense:  30 tablet    Refill:  5          GYN counsel breast self exam, mammography screening, menopause, adequate intake of calcium and vitamin D, diet and exercise    F/U  Return in about 1 year (around 07/08/2018).  Alicia B. Copland, PA-C 07/08/2017 3:18 PM

## 2017-07-08 NOTE — Patient Instructions (Signed)
I value your feedback and entrusting us with your care. If you get a [facility] patient survey, I would appreciate you taking the time to let us know about your experience today. Thank you! 

## 2017-07-18 MED FILL — VENLAFAXINE HCL 75 MG TAB: 75 | 30 days supply | Qty: 30 | Fill #0

## 2017-08-21 MED FILL — VENLAFAXINE HCL 75 MG TAB: 75 | 30 days supply | Qty: 30 | Fill #1

## 2017-09-23 MED FILL — VENLAFAXINE HCL 75 MG TAB: 75 | 30 days supply | Qty: 30 | Fill #2

## 2017-10-22 MED FILL — VENLAFAXINE HCL 75 MG TAB: 75 | 30 days supply | Qty: 30 | Fill #3

## 2017-11-21 MED FILL — VENLAFAXINE HCL 75 MG TAB: 75 | 30 days supply | Qty: 30 | Fill #4

## 2017-12-19 MED FILL — VENLAFAXINE HCL 75 MG TAB: 75 | 30 days supply | Qty: 30 | Fill #5

## 2017-12-23 ENCOUNTER — Encounter: Payer: Self-pay | Admitting: Physician Assistant

## 2017-12-23 ENCOUNTER — Ambulatory Visit: Payer: 59 | Admitting: Physician Assistant

## 2017-12-23 VITALS — BP 122/74 | HR 76 | Temp 98.3°F | Resp 16 | Wt 154.0 lb

## 2017-12-23 DIAGNOSIS — H60501 Unspecified acute noninfective otitis externa, right ear: Secondary | ICD-10-CM

## 2017-12-23 MED ORDER — CIPROFLOXACIN-DEXAMETHASONE 0.3-0.1 % OT SUSP: 4.0000 [drp] | Freq: Two times a day (BID) | OTIC | 0 refills | Status: DC

## 2017-12-23 NOTE — Progress Notes (Signed)
Patient: Catherine Ramos Female    DOB: February 01, 1961   57 y.o.   MRN: [id number] Visit Date: 12/24/2017  Today's Provider: Trinna Post, PA-C   Chief Complaint  Patient presents with  . Ear Pain    Started about two days ago.    Subjective:    Otalgia   There is pain in the right ear. This is a new problem. The current episode started in the past 7 days. There has been no fever. Associated symptoms include coughing, ear discharge and hearing loss. Pertinent negatives include no abdominal pain, diarrhea, headaches, neck pain, rash, rhinorrhea, sore throat or vomiting. Treatments tried: Allegra, Flonase. The treatment provided no relief.       Allergies  Allergen Reactions  . Diphenhydramine     tachycardia     Current Outpatient Medications:  .  CALCIUM CARBONATE-VIT D-MIN PO, Take by mouth., Disp: , Rfl:  .  fexofenadine (ALLEGRA ALLERGY) 180 MG tablet, Take by mouth., Disp: , Rfl:  .  fluticasone (FLONASE) 50 MCG/ACT nasal spray, , Disp: , Rfl: 12 .  venlafaxine (EFFEXOR) 75 MG tablet, Take 1 tablet (75 mg total) daily by mouth., Disp: 30 tablet, Rfl: 5 .  ciprofloxacin-dexamethasone (CIPRODEX) OTIC suspension, Place 4 drops into the right ear 2 (two) times daily., Disp: 7.5 mL, Rfl: 0  Review of Systems  Constitutional: Negative.   HENT: Positive for ear discharge, ear pain, hearing loss and tinnitus. Negative for congestion, dental problem, drooling, mouth sores, nosebleeds, postnasal drip, rhinorrhea, sinus pressure, sinus pain, sneezing, sore throat, trouble swallowing and voice change.   Respiratory: Positive for cough. Negative for apnea, choking, chest tightness, shortness of breath, wheezing and stridor.   Gastrointestinal: Negative.  Negative for abdominal pain, diarrhea and vomiting.  Musculoskeletal: Negative for neck pain.  Skin: Negative for rash.  Neurological: Negative for dizziness, light-headedness and headaches.    Social History   Tobacco Use    . Smoking status: Never Smoker  . Smokeless tobacco: Never Used  Substance Use Topics  . Alcohol use: No    Frequency: Never    Comment: rare   Objective:   BP 122/74 (BP Location: Right Arm, Patient Position: Sitting, Cuff Size: Normal)   Pulse 76   Temp 98.3 F (36.8 C) (Oral)   Resp 16   Wt 154 lb (69.9 kg)   BMI 27.28 kg/m  Vitals:   12/23/17 1452  BP: 122/74  Pulse: 76  Resp: 16  Temp: 98.3 F (36.8 C)  TempSrc: Oral  Weight: 154 lb (69.9 kg)     Physical Exam  Constitutional: She is oriented to person, place, and time. She appears well-developed and well-nourished.  HENT:  Right Ear: External ear normal. There is swelling. No tenderness.  Left Ear: External ear normal. No swelling or tenderness.  Nose: Nose normal.  Mouth/Throat: Oropharynx is clear and moist. No oropharyngeal exudate.  Significant auditory canal swelling.   Pulmonary/Chest: Effort normal and breath sounds normal.  Neurological: She is alert and oriented to person, place, and time.  Skin: Skin is warm and dry.  Psychiatric: She has a normal mood and affect. Her behavior is normal.        Assessment & Plan:     1. Acute otitis externa of right ear, unspecified type  - ciprofloxacin-dexamethasone (CIPRODEX) OTIC suspension; Place 4 drops into the right ear 2 (two) times daily.  Dispense: 7.5 mL; Refill: 0  Return if symptoms worsen or fail  to improve.  The entirety of the information documented in the History of Present Illness, Review of Systems and Physical Exam were personally obtained by me. Portions of this information were initially documented by Ashley Royalty, CMA and reviewed by me for thoroughness and accuracy.        Trinna Post, PA-C  [facility]

## 2017-12-23 NOTE — Patient Instructions (Signed)
Otitis Externa Otitis externa is an infection of the outer ear canal. The outer ear canal is the area between the outside of the ear and the eardrum. Otitis externa is sometimes called "swimmer's ear." Follow these instructions at home:  If you were given antibiotic ear drops, use them as told by your doctor. Do not stop using them even if your condition gets better.  Take over-the-counter and prescription medicines only as told by your doctor.  Keep all follow-up visits as told by your doctor. This is important. How is this prevented?  Keep your ear dry. Use the corner of a towel to dry your ear after you swim or bathe.  Try not to scratch or put things in your ear. Doing these things makes it easier for germs to grow in your ear.  Avoid swimming in lakes, dirty water, or pools that may not have the right amount of a chemical called chlorine.  Consider making ear drops and putting 3 or 4 drops in each ear after you swim. Ask your doctor about how you can make ear drops. Contact a doctor if:  You have a fever.  After 3 days your ear is still red, swollen, or painful.  After 3 days you still have pus coming from your ear.  Your redness, swelling, or pain gets worse.  You have a really bad headache.  You have redness, swelling, pain, or tenderness behind your ear. This information is not intended to replace advice given to you by your health care provider. Make sure you discuss any questions you have with your health care provider. Document Released: 01/23/2008 Document Revised: 09/01/2015 Document Reviewed: 05/16/2015 Elsevier Interactive Patient Education  2018 Elsevier Inc.  

## 2017-12-25 ENCOUNTER — Telehealth: Payer: Self-pay | Admitting: Family Medicine

## 2017-12-25 DIAGNOSIS — H6091 Unspecified otitis externa, right ear: Secondary | ICD-10-CM

## 2017-12-25 MED ORDER — NEOMYCIN-POLYMYXIN-HC 3.5-10000-1 OT SOLN: 3.0000 [drp] | Freq: Four times a day (QID) | OTIC | 0 refills | Status: DC

## 2017-12-25 MED FILL — NEO/POLYMYXIN/HC EAR SOLN: 3.5-10000-1 | 16 days supply | Qty: 10 | Fill #0

## 2017-12-25 NOTE — Telephone Encounter (Signed)
LMTCB on her work number, no answer on her Home number.   Thanks,   -Mickel Baas

## 2017-12-25 NOTE — Telephone Encounter (Signed)
It's only been one day, it will likely take a few days for it to work. Ciprodex has a steroid called Dexamethasone which is more powerful than hydrocortisone, but if she wants these drops I'll send them in. Additionally, this patient should be reminded that my personal e-mail is not an appropriate method by which to contact me and all patient care inquiries should conducted through official channels which include MyChart and calling the clinic.

## 2017-12-25 NOTE — Telephone Encounter (Signed)
Pt was in Monday for ear swelling and drainage in her right ear.  She was prescribed drops but they havent helped.  She said she is actually having pain in the rt ear now.  She wants to know if you prescribe Neomycin-polymyxin HC ear drop.  She uses [facility] outpatient [facility] today    Pt's call back today is [phone]  Thanks teri

## 2018-01-01 ENCOUNTER — Telehealth: Payer: Self-pay

## 2018-01-01 NOTE — Telephone Encounter (Signed)
Patient called to let Fabio Bering know that her ear is not feeling any better. She reports that she still has a fullness sensation, and feels that she needs an abx to "clear this up". [facility]. Please advise. Thanks!

## 2018-01-01 NOTE — Telephone Encounter (Signed)
If drops have not worked, she should be further evaluated, preferably by ENT since she has established there and she is currently symptomatic, they can better see what's going on. There is not a large role for oral antibiotics in outer ear infections unless a patient has diabetes or spreading infection and do not think prescription of this is appropriate without further eval.

## 2018-01-02 NOTE — Telephone Encounter (Signed)
Pt advised.  She will call ENT.   Thanks,   -Mickel Baas

## 2018-01-20 ENCOUNTER — Other Ambulatory Visit: Payer: Self-pay | Admitting: Obstetrics and Gynecology

## 2018-01-20 DIAGNOSIS — N951 Menopausal and female climacteric states: Secondary | ICD-10-CM

## 2018-01-20 MED FILL — VENLAFAXINE HCL 75 MG TAB: 75 | 30 days supply | Qty: 30 | Fill #0

## 2018-01-20 NOTE — Telephone Encounter (Signed)
Please advise thank you

## 2018-02-18 MED FILL — VENLAFAXINE HCL 75 MG TAB: 75 | 30 days supply | Qty: 30 | Fill #1

## 2018-03-20 MED FILL — VENLAFAXINE HCL 75 MG TAB: 75 | 30 days supply | Qty: 30 | Fill #2

## 2018-04-22 MED FILL — VENLAFAXINE HCL 75 MG TAB: 75 | 30 days supply | Qty: 30 | Fill #3

## 2018-05-21 MED FILL — VENLAFAXINE HCL 75 MG TAB: 75 | 30 days supply | Qty: 30 | Fill #4

## 2018-06-19 MED FILL — VENLAFAXINE HCL 75 MG TAB: 75 | 30 days supply | Qty: 30 | Fill #5

## 2018-07-09 ENCOUNTER — Encounter: Payer: Self-pay | Admitting: Obstetrics and Gynecology

## 2018-07-09 ENCOUNTER — Ambulatory Visit (INDEPENDENT_AMBULATORY_CARE_PROVIDER_SITE_OTHER): Payer: 59 | Admitting: Obstetrics and Gynecology

## 2018-07-09 VITALS — BP 144/80 | HR 76 | Ht 63.0 in | Wt 148.0 lb

## 2018-07-09 DIAGNOSIS — N6099 Unspecified benign mammary dysplasia of unspecified breast: Secondary | ICD-10-CM

## 2018-07-09 DIAGNOSIS — R011 Cardiac murmur, unspecified: Secondary | ICD-10-CM

## 2018-07-09 DIAGNOSIS — Z01419 Encounter for gynecological examination (general) (routine) without abnormal findings: Secondary | ICD-10-CM | POA: Diagnosis not present

## 2018-07-09 DIAGNOSIS — N951 Menopausal and female climacteric states: Secondary | ICD-10-CM

## 2018-07-09 DIAGNOSIS — R928 Other abnormal and inconclusive findings on diagnostic imaging of breast: Secondary | ICD-10-CM

## 2018-07-09 DIAGNOSIS — Z8041 Family history of malignant neoplasm of ovary: Secondary | ICD-10-CM

## 2018-07-09 DIAGNOSIS — Z1211 Encounter for screening for malignant neoplasm of colon: Secondary | ICD-10-CM

## 2018-07-09 DIAGNOSIS — Z1239 Encounter for other screening for malignant neoplasm of breast: Secondary | ICD-10-CM

## 2018-07-09 MED ORDER — VENLAFAXINE HCL 75 MG PO TABS: 75.0000 mg | ORAL_TABLET | Freq: Every day | ORAL | 2 refills | Status: DC

## 2018-07-09 NOTE — Progress Notes (Signed)
PCP: Jerrol Banana., MD   Chief Complaint  Patient presents with  . Gynecologic Exam    HPI:      Ms. Catherine Ramos is a 57 y.o. G0P0000 who LMP was No LMP recorded. Patient is postmenopausal., presents today for her annual examination.  Her menses are absent due to menopause.  She does not have intermenstrual bleeding.  She does have vasomotor sx and uses effexor episodically with sx relief. Winter months are better than hotter months.   Sex activity: single partner, contraception - post menopausal status. She does have vaginal dryness, improved with lubricants.   Last Pap: July 05, 2016  Results were: no abnormalities /neg HPV DNA.  Hx of STDs: none  Last mammogram: March 18, 2017  Results were: cat 4 RT breast. Pt had bx that showed ATYPICAL LOBULAR HYPERPLASIA (ALH) ASSOCIATED WITH A FEW VERY SMALL CALCIFICATIONS, SEE COMMENT. COLUMNAR CELL CHANGE ASSOCIATED WITH CALCIFICATIONS. NEGATIVE FOR CARCINOMA. Pt offered 2nd opinion with general surgery but declined. Has not had mammo yet this yr.   There is no FH of breast cancer. There is a FH of ovarian cancer in her PGM. Pt is MyRisk neg 2016. The patient does do self-breast exams.  Colonoscopy: Never. Pt interested in cologard.   Tobacco use: The patient denies current or previous tobacco use. Alcohol use: none Exercise: moderately active  She does get adequate calcium and Vitamin D in her diet.  Didn't do fasting labs last yr.  Has a history of heart murmur. Has always had it but it has never been eval. No SOB, dyspnea, chest pain.   Past Medical History:  Diagnosis Date  . 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 BRCA negative 08/2014   MyRisk neg; IBIS=10%  . Family history of ovarian cancer   . Heart murmur   . Vasomotor symptoms due to menopause     Past Surgical History:  Procedure Laterality Date  . BREAST BIOPSY Left   . BREAST BIOPSY Right 03/27/2017   stereo bx path  pending    Family History  Problem Relation Age of Onset  . Ovarian cancer Paternal Grandmother 17    Social History   Socioeconomic History  . Marital status: Married    Spouse name: Not on file  . Number of children: Not on file  . Years of education: Not on file  . Highest education level: Not on file  Occupational History  . Occupation: Dentist: [employer]  . Financial resource strain: Not on file  . Food insecurity:    Worry: Not on file    Inability: Not on file  . Transportation needs:    Medical: Not on file    Non-medical: Not on file  Tobacco Use  . Smoking status: Never Smoker  . Smokeless tobacco: Never Used  Substance and Sexual Activity  . Alcohol use: No    Frequency: Never    Comment: rare  . Drug use: No  . Sexual activity: Yes    Birth control/protection: Post-menopausal  Lifestyle  . Physical activity:    Days per week: 3 days    Minutes per session: 60 min  . Stress: Not at all  Relationships  . Social connections:    Talks on phone: Once a week    Gets together: Twice a week    Attends religious service: 1 to 4 times per year    Active member  of club or organization: No    Attends meetings of clubs or organizations: Never    Relationship status: Married  . Intimate partner violence:    Fear of current or ex partner: No    Emotionally abused: No    Physically abused: No    Forced sexual activity: No  Other Topics Concern  . Not on file  Social History Narrative  . Not on file    Current Meds  Medication Sig  . CALCIUM CARBONATE-VIT D-MIN PO Take by mouth.  . fexofenadine (ALLEGRA ALLERGY) 180 MG tablet Take by mouth.  . venlafaxine (EFFEXOR) 75 MG tablet Take 1 tablet (75 mg total) by mouth daily.  . [DISCONTINUED] venlafaxine (EFFEXOR) 75 MG tablet TAKE 1 TABLET BY MOUTH DAILY      ROS:  Review of Systems  Constitutional: Negative for fatigue, fever and unexpected weight change.    Respiratory: Negative for cough, shortness of breath and wheezing.   Cardiovascular: Negative for chest pain, palpitations and leg swelling.  Gastrointestinal: Negative for blood in stool, constipation, diarrhea, nausea and vomiting.  Endocrine: Negative for cold intolerance, heat intolerance and polyuria.  Genitourinary: Negative for dyspareunia, dysuria, flank pain, frequency, genital sores, hematuria, menstrual problem, pelvic pain, urgency, vaginal bleeding, vaginal discharge and vaginal pain.  Musculoskeletal: Negative for back pain, joint swelling and myalgias.  Skin: Negative for rash.  Neurological: Negative for dizziness, syncope, light-headedness, numbness and headaches.  Hematological: Negative for adenopathy.  Psychiatric/Behavioral: Negative for agitation, confusion, sleep disturbance and suicidal ideas. The patient is not nervous/anxious.      Objective: BP (!) 144/80   Pulse 76   Ht 5' 3"  (1.6 m)   Wt 148 lb (67.1 kg)   BMI 26.22 kg/m    Physical Exam  Constitutional: She is oriented to person, place, and time. She appears well-developed and well-nourished.  Genitourinary: Rectum normal, vagina normal and uterus normal. There is no rash or tenderness on the right labia. There is no rash or tenderness on the left labia. No erythema or tenderness in the vagina. No vaginal discharge found. Right adnexum does not display mass and does not display tenderness. Left adnexum does not display mass and does not display tenderness. Cervix does not exhibit motion tenderness or polyp. Uterus is not enlarged or tender. Rectal exam shows no fissure, no mass, no tenderness and guaiac negative stool.  Neck: Normal range of motion. No thyromegaly present.  Cardiovascular: Normal rate and regular rhythm.  Murmur heard.  Systolic murmur is present with a grade of 4/6. Pulmonary/Chest: Effort normal and breath sounds normal. Right breast exhibits no mass, no nipple discharge, no skin change  and no tenderness. Left breast exhibits no mass, no nipple discharge, no skin change and no tenderness.  Abdominal: Soft. There is no tenderness. There is no guarding.  Musculoskeletal: Normal range of motion.  Neurological: She is alert and oriented to person, place, and time. No cranial nerve deficit.  Psychiatric: She has a normal mood and affect. Her behavior is normal.  Vitals reviewed.   Assessment/Plan:  Encounter for annual routine gynecological examination  Screening for breast cancer - Izora Gala to sched dx mammo and u/s. Will f/u with results.  - Plan: MM DIAG BREAST TOMO BILATERAL, US BREAST LTD UNI RIGHT INC AXILLA  Abnormal mammogram of right breast - Plan: MM DIAG BREAST TOMO BILATERAL, US BREAST LTD UNI RIGHT INC AXILLA  Atypical lobular hyperplasia (ALH) of breast - Plan: MM DIAG BREAST TOMO BILATERAL, US BREAST LTD  UNI RIGHT INC AXILLA  Screening for colon cancer - Pt declines scr colonoscopy but interested in cologuard. Ref sent. Will f/u with resutls.  - Plan: Cologuard  Vasomotor symptoms due to menopause - Rx RF effexor prn. - Plan: venlafaxine (EFFEXOR) 75 MG tablet  Family history of ovarian cancer - Pt is MyRisk neg. No further screening needed.  Heart murmur - Never had eval. Refer to cardio for eval/f/u. - Plan: Ambulatory referral to Cardiology   Meds ordered this encounter  Medications  . venlafaxine (EFFEXOR) 75 MG tablet    Sig: Take 1 tablet (75 mg total) by mouth daily.    Dispense:  90 tablet    Refill:  2    Order Specific Question:   Supervising Provider    Answer:   Gae Dry [[id number]]          GYN counsel breast self exam, mammography screening, menopause, adequate intake of calcium and vitamin D, diet and exercise    F/U  Return in about 1 year (around 07/10/2019).  Donique Hammonds B. Oneta Sigman, PA-C 07/09/2018 2:24 PM

## 2018-07-09 NOTE — Patient Instructions (Signed)
I value your feedback and entrusting us with your care. If you get a [facility] patient survey, I would appreciate you taking the time to let us know about your experience today. Thank you! 

## 2018-07-16 MED FILL — VENLAFAXINE HCL 75 MG TAB: 75 | 90 days supply | Qty: 90 | Fill #0

## 2018-07-20 DIAGNOSIS — Z1211 Encounter for screening for malignant neoplasm of colon: Secondary | ICD-10-CM

## 2018-07-20 HISTORY — DX: Encounter for screening for malignant neoplasm of colon: Z12.11

## 2018-07-28 DIAGNOSIS — Z1211 Encounter for screening for malignant neoplasm of colon: Secondary | ICD-10-CM | POA: Diagnosis not present

## 2018-07-28 DIAGNOSIS — Z1212 Encounter for screening for malignant neoplasm of rectum: Secondary | ICD-10-CM | POA: Diagnosis not present

## 2018-07-29 ENCOUNTER — Ambulatory Visit
Admission: RE | Admit: 2018-07-29 | Discharge: 2018-07-29 | Disposition: A | Discharge status: Home / Self Care | Payer: 59 | Source: Ambulatory Visit | Attending: Obstetrics and Gynecology | Admitting: Obstetrics and Gynecology

## 2018-07-29 ENCOUNTER — Other Ambulatory Visit: Payer: Self-pay | Admitting: Obstetrics and Gynecology

## 2018-07-29 DIAGNOSIS — N6099 Unspecified benign mammary dysplasia of unspecified breast: Secondary | ICD-10-CM | POA: Insufficient documentation

## 2018-07-29 DIAGNOSIS — R928 Other abnormal and inconclusive findings on diagnostic imaging of breast: Secondary | ICD-10-CM | POA: Insufficient documentation

## 2018-07-29 DIAGNOSIS — Z1239 Encounter for other screening for malignant neoplasm of breast: Secondary | ICD-10-CM | POA: Insufficient documentation

## 2018-07-29 DIAGNOSIS — N6311 Unspecified lump in the right breast, upper outer quadrant: Secondary | ICD-10-CM | POA: Diagnosis not present

## 2018-07-29 DIAGNOSIS — R921 Mammographic calcification found on diagnostic imaging of breast: Secondary | ICD-10-CM | POA: Diagnosis not present

## 2018-07-29 DIAGNOSIS — N6310 Unspecified lump in the right breast, unspecified quadrant: Secondary | ICD-10-CM

## 2018-07-31 LAB — COLOGUARD

## 2018-08-04 ENCOUNTER — Ambulatory Visit
Admission: RE | Admit: 2018-08-04 | Discharge: 2018-08-04 | Disposition: A | Discharge status: Home / Self Care | Payer: 59 | Source: Ambulatory Visit | Attending: Obstetrics and Gynecology | Admitting: Obstetrics and Gynecology

## 2018-08-04 DIAGNOSIS — N6311 Unspecified lump in the right breast, upper outer quadrant: Secondary | ICD-10-CM | POA: Diagnosis not present

## 2018-08-04 DIAGNOSIS — N6310 Unspecified lump in the right breast, unspecified quadrant: Secondary | ICD-10-CM | POA: Insufficient documentation

## 2018-08-04 DIAGNOSIS — R928 Other abnormal and inconclusive findings on diagnostic imaging of breast: Secondary | ICD-10-CM

## 2018-08-04 DIAGNOSIS — N641 Fat necrosis of breast: Secondary | ICD-10-CM | POA: Diagnosis not present

## 2018-08-05 ENCOUNTER — Telehealth: Payer: Self-pay | Admitting: Obstetrics and Gynecology

## 2018-08-05 ENCOUNTER — Encounter: Payer: Self-pay | Admitting: Obstetrics and Gynecology

## 2018-08-05 LAB — SURGICAL PATHOLOGY

## 2018-08-05 NOTE — Telephone Encounter (Signed)
Pt aware of neg Cologuard results. Repeat in 3 yrs.

## 2018-08-06 HISTORY — PX: BREAST BIOPSY: SHX20

## 2018-09-23 ENCOUNTER — Ambulatory Visit: Payer: 59 | Admitting: Cardiovascular Disease

## 2018-09-23 ENCOUNTER — Encounter: Payer: Self-pay | Admitting: Cardiovascular Disease

## 2018-09-23 VITALS — BP 140/76 | HR 97 | Ht 63.0 in | Wt 147.0 lb

## 2018-09-23 DIAGNOSIS — R011 Cardiac murmur, unspecified: Secondary | ICD-10-CM

## 2018-09-23 NOTE — Progress Notes (Signed)
Cardiology Office Note   Date:  09/23/2018   ID:  Catherine Ramos, DOB August 15, 1961, MRN [id number]  PCP:  Jerrol Banana., MD  Cardiologist:  Kathlyn Sacramento, MD  Chief Complaint  Patient presents with  . New Patient (Initial Visit)    Murmur per Catherine Ramos. Patient c/o left leg numbness when standing.  Patient denies chest pain and SOB.       History of Present Illness: Catherine Ramos is a 58 y.o. female who was referred by Catherine Ramos for evaluation of a cardiac murmur.  The patient was told about a heart murmur since she was a teenager but has not had any work-up for this.  There is no history of rheumatic fever.  She has been healthy throughout her life with no chronic medical conditions.  She is not a smoker and does not drink alcohol.  There is no family history of cardiac disease.  She works in Engineer, technical sales at [employer]. She denies any chest pain, shortness of breath or palpitations.  She recently noted some numbness involving her left leg when standing.    Past Medical History:  Diagnosis Date  . 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 BRCA negative 08/2014   MyRisk neg; IBIS=10%  . Family history of ovarian cancer   . Heart murmur   . Screening for colon cancer 07/2018   neg Cologuard; repeat due in 3 yrs  . Vasomotor symptoms due to menopause     Past Surgical History:  Procedure Laterality Date  . BREAST BIOPSY Left 2016   neg  . BREAST BIOPSY Right 03/27/2017   [facility]     Current Outpatient Medications  Medication Sig Dispense Refill  . CALCIUM CARBONATE-VIT D-MIN PO Take by mouth.    . fexofenadine (ALLEGRA ALLERGY) 180 MG tablet Take by mouth.    . venlafaxine (EFFEXOR) 75 MG tablet Take 1 tablet (75 mg total) by mouth daily. 90 tablet 2   No current facility-administered medications for this visit.     Allergies:   Diphenhydramine    Social History:  The patient  reports that she has never smoked. She has never used smokeless  tobacco. She reports that she does not drink alcohol or use drugs.   Family History:  The patient's family history includes Ovarian cancer (age of onset: 36) in her paternal grandmother.    ROS:  Please see the history of present illness.   Otherwise, review of systems are positive for none.   All other systems are reviewed and negative.    PHYSICAL EXAM: VS:  BP 140/76 (BP Location: Left Arm, Patient Position: Sitting, Cuff Size: Normal)   Pulse 97   Ht _0  (1.6 m)   Wt 147 lb (66.7 kg)   BMI 26.04 kg/m  , BMI Body mass index is 26.04 kg/m. GEN: Well nourished, well developed, in no acute distress  HEENT: normal  Neck: no JVD, carotid bruits, or masses Cardiac: RRR; no  rubs, or gallops,no edema .  3 out of 6 systolic murmur in the pulmonic area and 2 out of 6 holosystolic murmur at the left sternal border Respiratory:  clear to auscultation bilaterally, normal work of breathing GI: soft, nontender, nondistended, + BS MS: no deformity or atrophy  Skin: warm and dry, no rash Neuro:  Strength and sensation are intact Psych: euthymic mood, full affect   EKG:  EKG is ordered today. The ekg ordered today demonstrates normal sinus rhythm  with possible left atrial enlargement and nonspecific ST changes.   Recent Labs: No results found for requested labs within last 8760 hours.    Lipid Panel    Component Value Date/Time   CHOL 211 (H) 08/04/2014 0826   TRIG 91 08/04/2014 0826   HDL 83 (H) 08/04/2014 0826   VLDL 18 08/04/2014 0826   LDLCALC 110 (H) 08/04/2014 0826      Wt Readings from Last 3 Encounters:  09/23/18 147 lb (66.7 kg)  07/09/18 148 lb (67.1 kg)  12/23/17 154 lb (69.9 kg)        ASSESSMENT AND PLAN:  1.  Cardiac murmur not previously evaluated: The murmur seems to be loudest in the pulmonic area.  Possibilities include congenital pulmonary stenosis or an ASD with increased right-sided flow.  EKG shows possible left atrial enlargement. I requested an  echocardiogram for evaluation.  She is currently asymptomatic from a cardiac standpoint.  2.  Left leg numbness: Unclear etiology.  Her distal pulses are normal.  She should follow-up with PCP about this.   Orders Placed This Encounter  Procedures  . EKG 12-Lead  . ECHOCARDIOGRAM COMPLETE     Disposition:   FU with me in 1 year  Signed, Kathlyn Sacramento, MD 09/23/2018 3:38 PM    [place]

## 2018-09-23 NOTE — Patient Instructions (Signed)
Medication Instructions:  No changes If you need a refill on your cardiac medications before your next appointment, please call your pharmacy.   Lab work: None ordered  Testing/Procedures: Your physician has requested that you have an echocardiogram. Echocardiography is a painless test that uses sound waves to create images of your heart. It provides your doctor with information about the size and shape of your heart and how well your heart's chambers and valves are working. You may receive an ultrasound enhancing agent through an IV if needed to better visualize your heart during the echo.This procedure takes approximately one hour. There are no restrictions for this procedure. This will take place at the [facility] clinic.    Follow-Up: At [facility], you and your health needs are our priority.  As part of our continuing mission to provide you with exceptional heart care, we have created designated Provider Care Teams.  These Care Teams include your primary Cardiologist (physician) and Advanced Practice Providers (APPs -  Physician Assistants and Nurse Practitioners) who all work together to provide you with the care you need, when you need it. You will need a follow up appointment in 12 months.  Please call our office 2 months in advance to schedule this appointment.  You may see Dr. Fletcher Anon or one of the following Advanced Practice Providers on your designated Care Team:   Murray Hodgkins, NP Christell Faith, PA-C . Marrianne Mood, PA-C

## 2018-10-15 MED FILL — VENLAFAXINE HCL 75 MG TAB: 75 | 90 days supply | Qty: 90 | Fill #1 | Status: TO

## 2018-10-17 ENCOUNTER — Other Ambulatory Visit: Payer: 59

## 2018-11-10 ENCOUNTER — Telehealth: Payer: Self-pay | Admitting: Cardiovascular Disease

## 2018-11-10 NOTE — Telephone Encounter (Signed)
Patient cancelled echo due to covid 19 .  She states she will call back when she wants to reschedule .

## 2018-11-10 NOTE — Telephone Encounter (Signed)
   Primary Cardiologist: Kathlyn Sacramento, MD    Patient contacted.  History reviewed.  No symptoms to suggest any unstable cardiac conditions.  Based on discussion, with current pandemic situation, we will be postponing this appointment for this patient.  If symptoms change, she has been instructed to contact our office.   Routing to C19 CANCEL pool for tracking (P CV DIV CV19 CANCEL) and assigning priority (1 = 4-6 wks, 2 = 6-12 wks, 3 = >12 wks).       Marland Kitchen

## 2018-11-14 ENCOUNTER — Other Ambulatory Visit: Payer: 59

## 2019-02-02 DIAGNOSIS — H5213 Myopia, bilateral: Secondary | ICD-10-CM | POA: Diagnosis not present

## 2019-04-13 ENCOUNTER — Other Ambulatory Visit: Payer: Self-pay | Admitting: Obstetrics and Gynecology

## 2019-04-13 DIAGNOSIS — N951 Menopausal and female climacteric states: Secondary | ICD-10-CM

## 2019-04-13 NOTE — Telephone Encounter (Signed)
Please advise 

## 2019-07-13 ENCOUNTER — Other Ambulatory Visit: Payer: Self-pay | Admitting: Obstetrics and Gynecology

## 2019-07-13 DIAGNOSIS — N951 Menopausal and female climacteric states: Secondary | ICD-10-CM

## 2019-07-20 NOTE — Progress Notes (Deleted)
PCP: Jerrol Banana., MD   No chief complaint on file.   HPI:      Catherine Ramos is a 58 y.o. G0P0000 who LMP was No LMP recorded. Patient is postmenopausal., presents today for her annual examination.  Her menses are absent due to menopause.  She does not have intermenstrual bleeding.  She does have vasomotor sx and uses effexor episodically with sx relief. Winter months are better than hotter months.   Sex activity: single partner, contraception - post menopausal status. She does have vaginal dryness, improved with lubricants.   Last Pap: July 05, 2016  Results were: no abnormalities /neg HPV DNA.  Hx of STDs: none  Last mammogram:  07/29/18  Cat 4 RT breast, with fat necrosis on bx 12/19; 2018 Hx of RT breast ATYPICAL LOBULAR HYPERPLASIA (ALH) ASSOCIATED WITH A FEW VERY SMALL CALCIFICATIONS, SEE COMMENT. COLUMNAR CELL CHANGE ASSOCIATED WITH CALCIFICATIONS. NEGATIVE FOR CARCINOMA. Pt offered 2nd opinion with general surgery but declined. Has not had mammo yet this yr.   There is no FH of breast cancer. There is a FH of ovarian cancer in her PGM. Pt is MyRisk neg 2016. The patient does do self-breast exams.  Colonoscopy: Never. Neg cologard 2019, repeat due after 3 yrs.  Tobacco use: The patient denies current or previous tobacco use. Alcohol use: none  No drug use. Exercise: moderately active  She does get adequate calcium and Vitamin D in her diet.  Didn't do fasting labs last yr.  Has a history of heart murmur. Has always had it but it has never been eval. No SOB, dyspnea, chest pain.   Past Medical History:  Diagnosis Date  . 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 BRCA negative 08/2014   MyRisk neg; IBIS=10%  . Family history of ovarian cancer   . Heart murmur   . Screening for colon cancer 07/2018   neg Cologuard; repeat due in 3 yrs  . Vasomotor symptoms due to menopause     Past Surgical History:  Procedure Laterality  Date  . BREAST BIOPSY Left 2016   neg  . BREAST BIOPSY Right 03/27/2017   ALH    Family History  Problem Relation Age of Onset  . Ovarian cancer Paternal Grandmother 53  . Breast cancer Neg Hx     Social History   Socioeconomic History  . Marital status: Married    Spouse name: Not on file  . Number of children: Not on file  . Years of education: Not on file  . Highest education level: Not on file  Occupational History  . Occupation: Dentist: [employer]  . Financial resource strain: Not on file  . Food insecurity    Worry: Not on file    Inability: Not on file  . Transportation needs    Medical: Not on file    Non-medical: Not on file  Tobacco Use  . Smoking status: Never Smoker  . Smokeless tobacco: Never Used  Substance and Sexual Activity  . Alcohol use: No    Frequency: Never    Comment: rare  . Drug use: No  . Sexual activity: Yes    Birth control/protection: Post-menopausal  Lifestyle  . Physical activity    Days per week: 3 days    Minutes per session: 60 min  . Stress: Not at all  Relationships  . Social Herbalist on phone: Once  a week    Gets together: Twice a week    Attends religious service: 1 to 4 times per year    Active member of club or organization: No    Attends meetings of clubs or organizations: Never    Relationship status: Married  . Intimate partner violence    Fear of current or ex partner: No    Emotionally abused: No    Physically abused: No    Forced sexual activity: No  Other Topics Concern  . Not on file  Social History Narrative  . Not on file    No outpatient medications have been marked as taking for the 07/21/19 encounter (Appointment) with Gianny Killman, Deirdre Evener, PA-C.      ROS:  Review of Systems  Constitutional: Negative for fatigue, fever and unexpected weight change.  Respiratory: Negative for cough, shortness of breath and wheezing.   Cardiovascular: Negative for  chest pain, palpitations and leg swelling.  Gastrointestinal: Negative for blood in stool, constipation, diarrhea, nausea and vomiting.  Endocrine: Negative for cold intolerance, heat intolerance and polyuria.  Genitourinary: Negative for dyspareunia, dysuria, flank pain, frequency, genital sores, hematuria, menstrual problem, pelvic pain, urgency, vaginal bleeding, vaginal discharge and vaginal pain.  Musculoskeletal: Negative for back pain, joint swelling and myalgias.  Skin: Negative for rash.  Neurological: Negative for dizziness, syncope, light-headedness, numbness and headaches.  Hematological: Negative for adenopathy.  Psychiatric/Behavioral: Negative for agitation, confusion, sleep disturbance and suicidal ideas. The patient is not nervous/anxious.      Objective: There were no vitals taken for this visit.   Physical Exam Constitutional:      Appearance: She is well-developed.  Genitourinary:     Vagina, uterus and rectum normal.     No vaginal discharge, erythema or tenderness.     No cervical motion tenderness or polyp.     Uterus is not enlarged or tender.     No right or left adnexal mass present.     Right adnexa not tender.     Left adnexa not tender.  Rectum:     Guaiac result negative.     No rectal mass, anal fissure or tenderness.  Neck:     Musculoskeletal: Normal range of motion.     Thyroid: No thyromegaly.  Cardiovascular:     Rate and Rhythm: Normal rate and regular rhythm.     Heart sounds: Murmur present. Systolic murmur present with a grade of 4/6.  Pulmonary:     Effort: Pulmonary effort is normal.     Breath sounds: Normal breath sounds.  Chest:     Breasts:        Right: No mass, nipple discharge, skin change or tenderness.        Left: No mass, nipple discharge, skin change or tenderness.  Abdominal:     Palpations: Abdomen is soft.     Tenderness: There is no abdominal tenderness. There is no guarding.  Musculoskeletal: Normal range of  motion.  Neurological:     Mental Status: She is alert and oriented to person, place, and time.     Cranial Nerves: No cranial nerve deficit.  Psychiatric:        Behavior: Behavior normal.  Vitals signs reviewed.     Assessment/Plan:  No diagnosis found.   No orders of the defined types were placed in this encounter.         GYN counsel breast self exam, mammography screening, menopause, adequate intake of calcium and vitamin D, diet and  exercise    F/U  No follow-ups on file.  Franko Hilliker B. Shella Lahman, PA-C 07/20/2019 7:47 PM

## 2019-07-21 ENCOUNTER — Ambulatory Visit: Payer: 59 | Admitting: Obstetrics and Gynecology

## 2019-08-12 ENCOUNTER — Other Ambulatory Visit: Payer: Self-pay

## 2019-08-12 ENCOUNTER — Encounter: Payer: Self-pay | Admitting: Obstetrics and Gynecology

## 2019-08-12 ENCOUNTER — Other Ambulatory Visit (HOSPITAL_COMMUNITY)
Admission: RE | Admit: 2019-08-12 | Discharge: 2019-08-12 | Disposition: A | Discharge status: Home / Self Care | Payer: 59 | Source: Ambulatory Visit | Attending: Obstetrics and Gynecology | Admitting: Obstetrics and Gynecology

## 2019-08-12 ENCOUNTER — Ambulatory Visit (INDEPENDENT_AMBULATORY_CARE_PROVIDER_SITE_OTHER): Payer: 59 | Admitting: Obstetrics and Gynecology

## 2019-08-12 VITALS — BP 130/90 | Ht 63.0 in | Wt 158.0 lb

## 2019-08-12 DIAGNOSIS — Z124 Encounter for screening for malignant neoplasm of cervix: Secondary | ICD-10-CM

## 2019-08-12 DIAGNOSIS — Z01419 Encounter for gynecological examination (general) (routine) without abnormal findings: Secondary | ICD-10-CM | POA: Diagnosis not present

## 2019-08-12 DIAGNOSIS — Z0000 Encounter for general adult medical examination without abnormal findings: Secondary | ICD-10-CM

## 2019-08-12 DIAGNOSIS — N951 Menopausal and female climacteric states: Secondary | ICD-10-CM

## 2019-08-12 DIAGNOSIS — Z1151 Encounter for screening for human papillomavirus (HPV): Secondary | ICD-10-CM

## 2019-08-12 DIAGNOSIS — Z13220 Encounter for screening for lipoid disorders: Secondary | ICD-10-CM

## 2019-08-12 DIAGNOSIS — Z131 Encounter for screening for diabetes mellitus: Secondary | ICD-10-CM

## 2019-08-12 DIAGNOSIS — Z8041 Family history of malignant neoplasm of ovary: Secondary | ICD-10-CM

## 2019-08-12 DIAGNOSIS — R928 Other abnormal and inconclusive findings on diagnostic imaging of breast: Secondary | ICD-10-CM

## 2019-08-12 DIAGNOSIS — Z1231 Encounter for screening mammogram for malignant neoplasm of breast: Secondary | ICD-10-CM

## 2019-08-12 MED ORDER — VENLAFAXINE HCL 75 MG PO TABS: 75.0000 mg | ORAL_TABLET | Freq: Every day | ORAL | 3 refills | Status: DC

## 2019-08-12 NOTE — Patient Instructions (Signed)
I value your feedback and entrusting us with your care. If you get a [facility] patient survey, I would appreciate you taking the time to let us know about your experience today. Thank you!  As of July 30, 2019, your lab results will be released to your MyChart immediately, before I even have a chance to see them. Please give me time to review them and contact you if there are any abnormalities. Thank you for your patience.  

## 2019-08-12 NOTE — Progress Notes (Signed)
PCP: Jerrol Banana., MD   Chief Complaint  Patient presents with  . Gynecologic Exam    HPI:      Ms. Catherine Ramos is a 58 y.o. G0P0000 who LMP was No LMP recorded. Patient is postmenopausal., presents today for her annual examination.  Her menses are absent due to menopause.  She does not have intermenstrual bleeding.  She does have vasomotor sx and uses effexor episodically with sx relief. Winter months are better than hotter months. Needs Rx RF.  Sex activity: single partner, contraception - post menopausal status. She does have vaginal dryness, improved with lubricants.   Last Pap: July 05, 2016  Results were: no abnormalities /neg HPV DNA.  Hx of STDs: none  Last mammogram:  07/29/18  Cat 4 RT breast, with fat necrosis on bx 12/19; Due for dx mammo this yr.  2018 Hx of RT breast ATYPICAL LOBULAR HYPERPLASIA (ALH) ASSOCIATED WITH A FEW VERY SMALL CALCIFICATIONS, SEE COMMENT. COLUMNAR CELL CHANGE ASSOCIATED WITH CALCIFICATIONS. NEGATIVE FOR CARCINOMA.  There is no FH of breast cancer. There is a FH of ovarian cancer in her PGM. Pt is MyRisk neg 2016. The patient does do self-breast exams.  Colonoscopy: Never. Neg cologard 2019, repeat due after 3 yrs.  Tobacco use: The patient denies current or previous tobacco use. Alcohol use: none  No drug use. Exercise: moderately active  She does get adequate calcium and Vitamin D in her diet.  Didn't do fasting labs last yr. Will do this yr.  Has a history of heart murmur. Has always had it but never had eval. Saw cardio last yr who recommended further testing but then covid hit. Will follow up with him now for further eval.  Past Medical History:  Diagnosis Date  . 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 BRCA negative 08/2014   MyRisk neg; IBIS=10%  . Family history of ovarian cancer   . Heart murmur   . Screening for colon cancer 07/2018   neg Cologuard; repeat due in 3 yrs  . Vasomotor  symptoms due to menopause     Past Surgical History:  Procedure Laterality Date  . BREAST BIOPSY Left 2016   neg  . BREAST BIOPSY Right 03/27/2017   ALH    Family History  Problem Relation Age of Onset  . Ovarian cancer Paternal Grandmother 59  . Breast cancer Neg Hx     Social History   Socioeconomic History  . Marital status: Married    Spouse name: Not on file  . Number of children: Not on file  . Years of education: Not on file  . Highest education level: Not on file  Occupational History  . Occupation: Dentist: [employer]  Tobacco Use  . Smoking status: Never Smoker  . Smokeless tobacco: Never Used  Substance and Sexual Activity  . Alcohol use: No    Comment: rare  . Drug use: No  . Sexual activity: Yes    Birth control/protection: Post-menopausal  Other Topics Concern  . Not on file  Social History Narrative  . Not on file   Social Determinants of Health   Financial Resource Strain:   . Difficulty of Paying Living Expenses: Not on file  Food Insecurity:   . Worried About Charity fundraiser in the Last Year: Not on file  . Ran Out of Food in the Last Year: Not on file  Transportation Needs:   .  Lack of Transportation (Medical): Not on file  . Lack of Transportation (Non-Medical): Not on file  Physical Activity:   . Days of Exercise per Week: Not on file  . Minutes of Exercise per Session: Not on file  Stress:   . Feeling of Stress : Not on file  Social Connections:   . Frequency of Communication with Friends and Family: Not on file  . Frequency of Social Gatherings with Friends and Family: Not on file  . Attends Religious Services: Not on file  . Active Member of Clubs or Organizations: Not on file  . Attends Archivist Meetings: Not on file  . Marital Status: Not on file  Intimate Partner Violence:   . Fear of Current or Ex-Partner: Not on file  . Emotionally Abused: Not on file  . Physically Abused: Not on file  .  Sexually Abused: Not on file    Current Meds  Medication Sig  . Calcium Carbonate-Vitamin D 600-200 MG-UNIT TABS Take by mouth.  . fexofenadine (ALLEGRA ALLERGY) 180 MG tablet Take by mouth.  . venlafaxine (EFFEXOR) 75 MG tablet Take 1 tablet (75 mg total) by mouth daily.  . [DISCONTINUED] venlafaxine (EFFEXOR) 75 MG tablet TAKE 1 TABLET BY MOUTH DAILY.      ROS:  Review of Systems  Constitutional: Negative for fatigue, fever and unexpected weight change.  Respiratory: Negative for cough, shortness of breath and wheezing.   Cardiovascular: Negative for chest pain, palpitations and leg swelling.  Gastrointestinal: Negative for blood in stool, constipation, diarrhea, nausea and vomiting.  Endocrine: Negative for cold intolerance, heat intolerance and polyuria.  Genitourinary: Negative for dyspareunia, dysuria, flank pain, frequency, genital sores, hematuria, menstrual problem, pelvic pain, urgency, vaginal bleeding, vaginal discharge and vaginal pain.  Musculoskeletal: Negative for back pain, joint swelling and myalgias.  Skin: Negative for rash.  Neurological: Negative for dizziness, syncope, light-headedness, numbness and headaches.  Hematological: Negative for adenopathy.  Psychiatric/Behavioral: Negative for agitation, confusion, sleep disturbance and suicidal ideas. The patient is not nervous/anxious.      Objective: BP 130/90   Ht 5' 3"  (1.6 m)   Wt 158 lb (71.7 kg)   BMI 27.99 kg/m    Physical Exam Constitutional:      Appearance: She is well-developed.  Genitourinary:     Vulva, vagina, uterus, right adnexa, left adnexa and rectum normal.     No vulval lesion or tenderness noted.     No vaginal discharge, erythema or tenderness.     No cervical motion tenderness or polyp.     Uterus is not enlarged or tender.     No right or left adnexal mass present.     Right adnexa not tender.     Left adnexa not tender.  Rectum:     Guaiac result negative.     No rectal  mass, anal fissure or tenderness.  Neck:     Thyroid: No thyromegaly.  Cardiovascular:     Rate and Rhythm: Normal rate and regular rhythm.     Heart sounds: Murmur present. Systolic murmur present with a grade of 4/6.  Pulmonary:     Effort: Pulmonary effort is normal.     Breath sounds: Normal breath sounds.  Chest:     Breasts:        Right: No mass, nipple discharge, skin change or tenderness.        Left: No mass, nipple discharge, skin change or tenderness.  Abdominal:     Palpations: Abdomen is  soft.     Tenderness: There is no abdominal tenderness. There is no guarding.  Musculoskeletal:        General: Normal range of motion.     Cervical back: Normal range of motion.  Neurological:     General: No focal deficit present.     Mental Status: She is alert and oriented to person, place, and time.     Cranial Nerves: No cranial nerve deficit.  Skin:    General: Skin is warm and dry.  Psychiatric:        Mood and Affect: Mood normal.        Behavior: Behavior normal.        Thought Content: Thought content normal.        Judgment: Judgment normal.  Vitals reviewed.     Assessment/Plan:  Encounter for annual routine gynecological examination  Cervical cancer screening - Plan: [facility] PAP  Screening for HPV (human papillomavirus) - Plan: [facility] PAP  Encounter for screening mammogram for malignant neoplasm of breast - Plan: DIAG MAMMO BREAST BILATERAL,   Abnormal mammogram - Plan: DIAG MAMMO BREAST BILATERAL; Dx mammo due this yr. Nancy to sched.   Family history of ovarian cancer--pt is myRisk neg, no further screening indicated.  Vasomotor symptoms due to menopause - Rx RF effexor prn. - Plan: venlafaxine (EFFEXOR) 75 MG tablet  Blood tests for routine general physical examination - Plan: Comprehensive metabolic panel, Lipid panel, Hemoglobin A1c  Screening cholesterol level - Plan: Lipid panel  Screening for diabetes mellitus - Plan: Hemoglobin A1c   Meds ordered  this encounter  Medications  . venlafaxine (EFFEXOR) 75 MG tablet    Sig: Take 1 tablet (75 mg total) by mouth daily.    Dispense:  90 tablet    Refill:  3    Order Specific Question:   Supervising Provider    Answer:   Gae Dry [[id number]]          GYN counsel breast self exam, mammography screening, menopause, adequate intake of calcium and vitamin D, diet and exercise    F/U  Return in about 1 year (around 08/11/2020).  Jadalynn Burr B. Keegan Ducey, PA-C 08/12/2019 4:32 PM

## 2019-08-18 LAB — CYTOLOGY - PAP
Comment: NEGATIVE
Diagnosis: NEGATIVE
High risk HPV: NEGATIVE

## 2019-09-22 ENCOUNTER — Other Ambulatory Visit: Payer: Self-pay | Admitting: Obstetrics and Gynecology

## 2019-09-22 DIAGNOSIS — R928 Other abnormal and inconclusive findings on diagnostic imaging of breast: Secondary | ICD-10-CM

## 2019-10-16 ENCOUNTER — Ambulatory Visit
Admission: RE | Admit: 2019-10-16 | Discharge: 2019-10-16 | Disposition: A | Discharge status: Home / Self Care | Payer: 59 | Source: Ambulatory Visit | Attending: Obstetrics and Gynecology | Admitting: Obstetrics and Gynecology

## 2019-10-16 DIAGNOSIS — R921 Mammographic calcification found on diagnostic imaging of breast: Secondary | ICD-10-CM | POA: Diagnosis not present

## 2019-10-16 DIAGNOSIS — Z1231 Encounter for screening mammogram for malignant neoplasm of breast: Secondary | ICD-10-CM | POA: Diagnosis not present

## 2019-10-16 DIAGNOSIS — R928 Other abnormal and inconclusive findings on diagnostic imaging of breast: Secondary | ICD-10-CM

## 2019-10-16 DIAGNOSIS — N6489 Other specified disorders of breast: Secondary | ICD-10-CM | POA: Diagnosis not present

## 2019-10-17 ENCOUNTER — Encounter: Payer: Self-pay | Admitting: Obstetrics and Gynecology

## 2019-11-10 ENCOUNTER — Ambulatory Visit: Payer: 59 | Admitting: Cardiovascular Disease

## 2019-11-27 ENCOUNTER — Ambulatory Visit: Payer: 59 | Admitting: Family

## 2019-12-18 IMAGING — MG DIGITAL DIAGNOSTIC BILATERAL MAMMOGRAM WITH TOMO AND CAD
8 of 11 series · 8 of 27 positions shown · non-contrast
Comparison: Previous exam(s).

CLINICAL DATA: 57-year-old female with history of stereotactic
biopsy of the right breast in Friday February, 2017. Results indicated
atypical lobular hyperplasia. Surgical excision was recommended,
however the patient declined to be seen by surgery.

EXAM:
DIGITAL DIAGNOSTIC BILATERAL MAMMOGRAM WITH CAD AND TOMO
ULTRASOUND RIGHT BREAST

[R ML (1 of 2)]
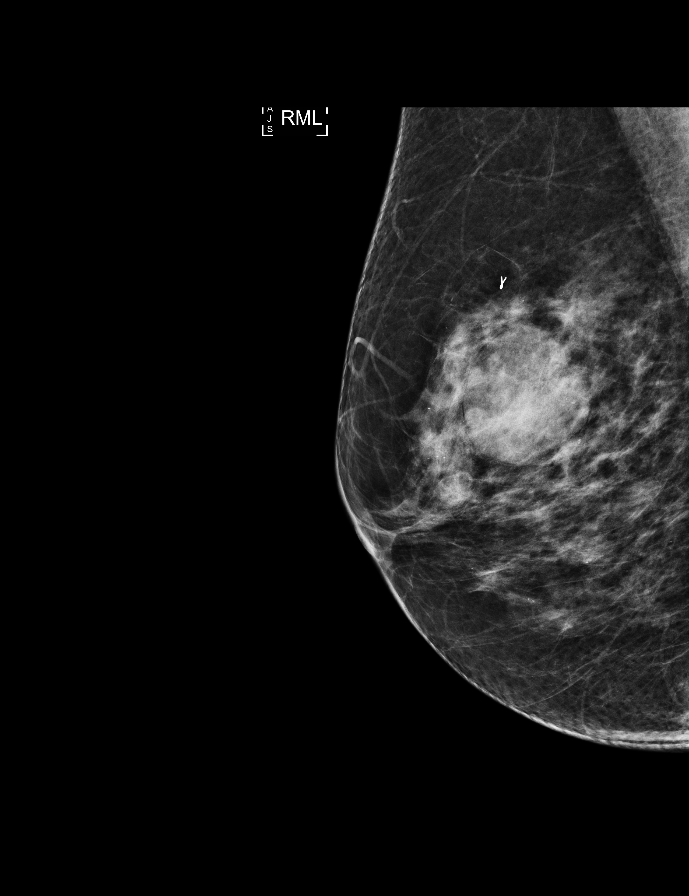

[R CC]
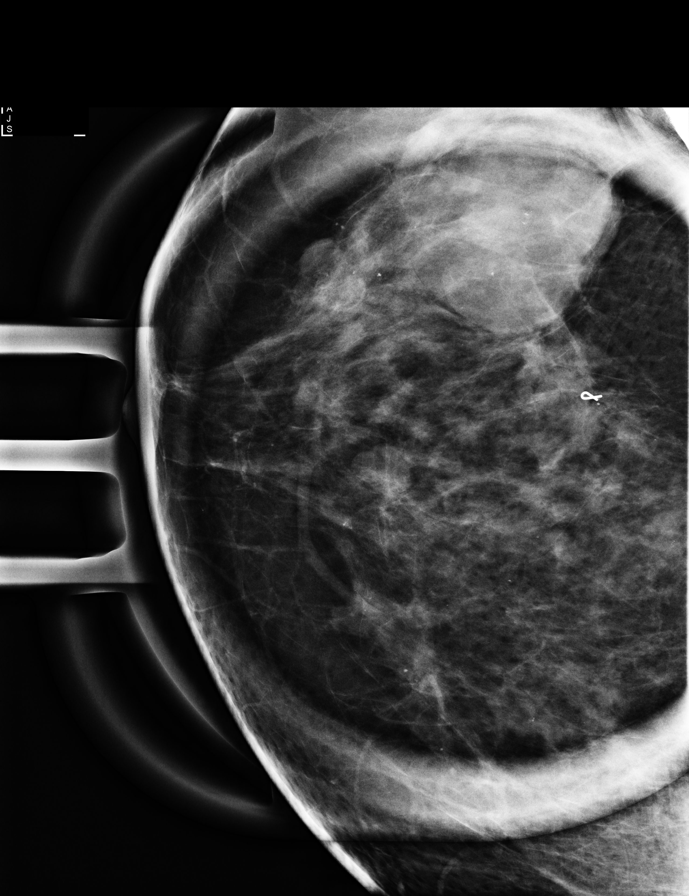

[R ML (2 of 2)]
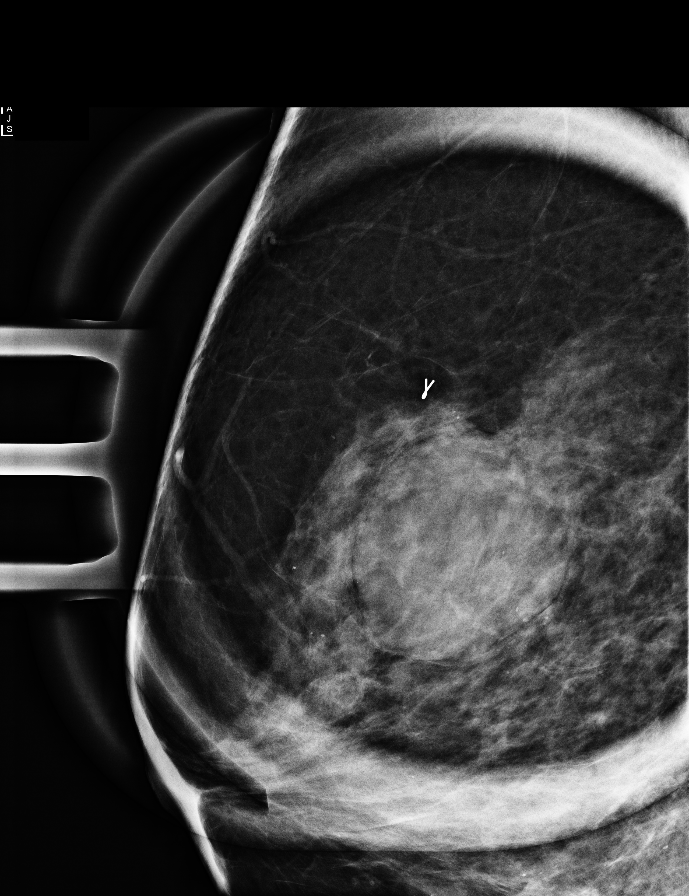

[L CC synth-2D]
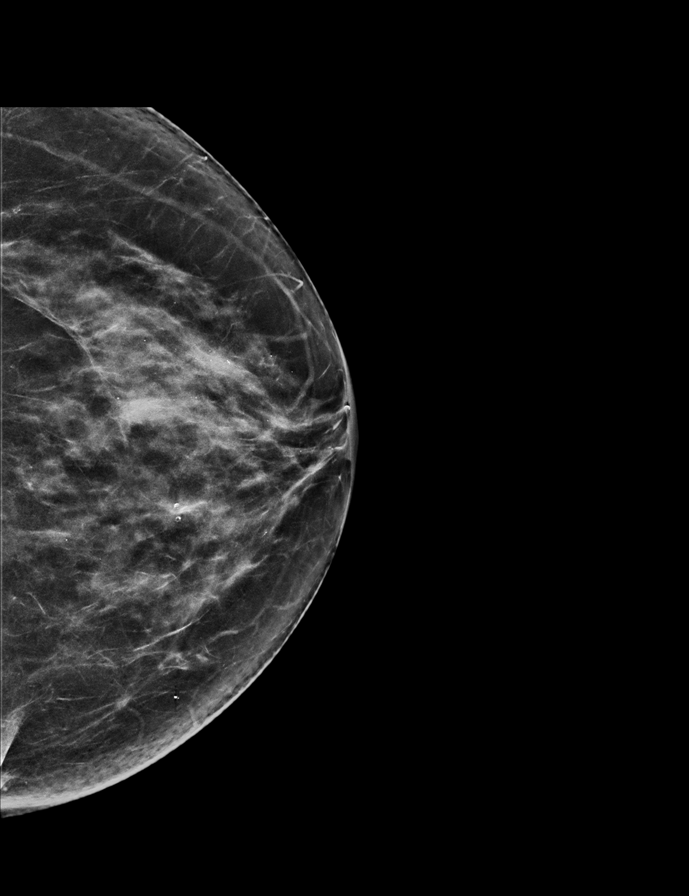

[R MLO synth-2D]
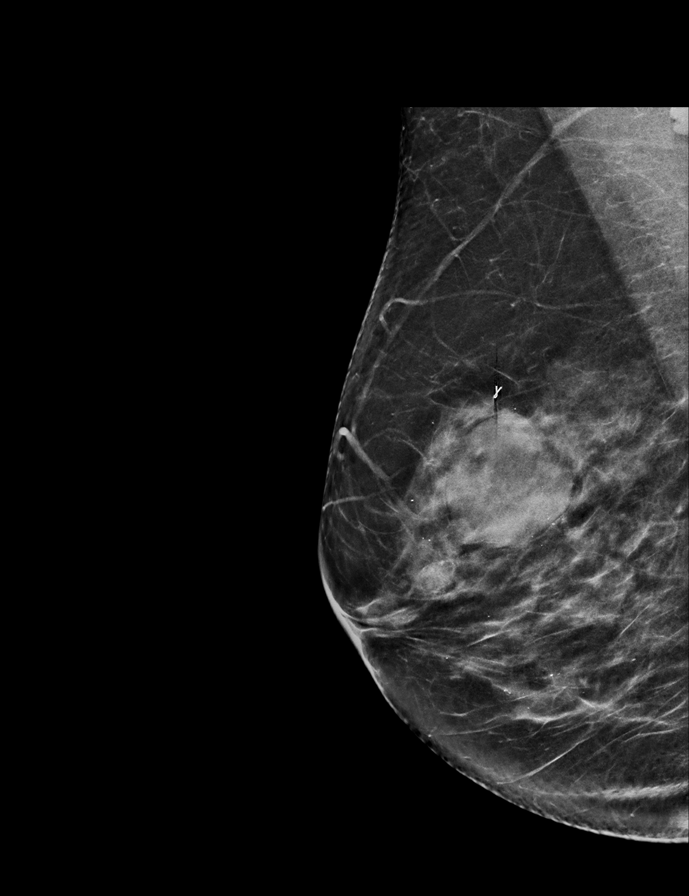

[R CC synth-2D]
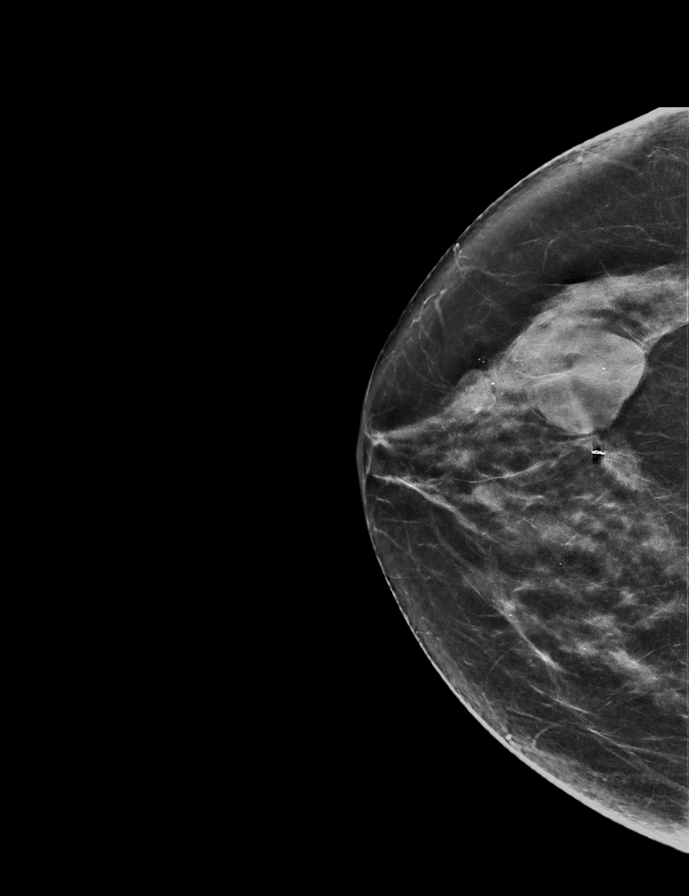

[L MLO synth-2D]
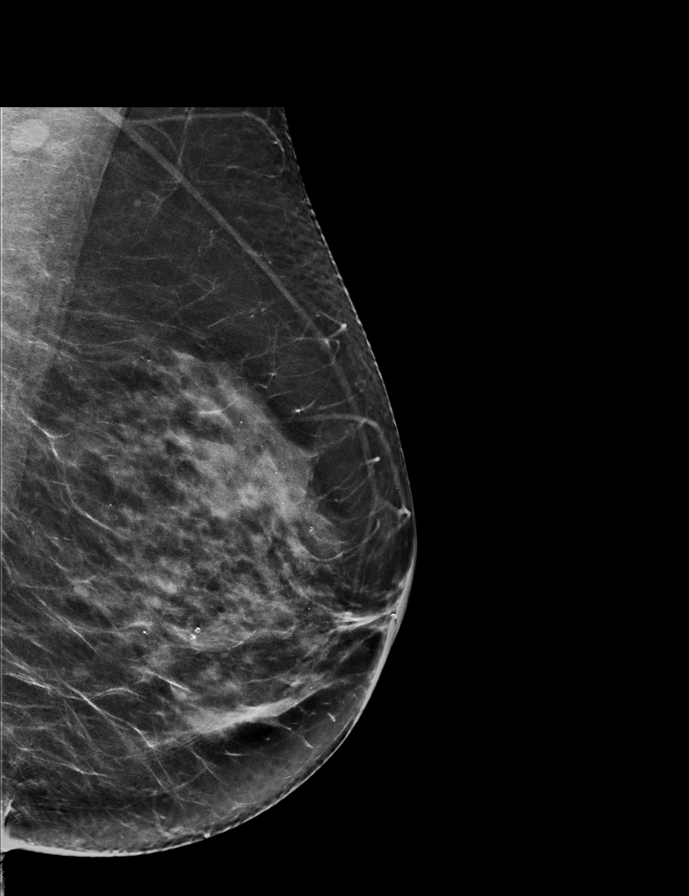

[R CC tomo · tomo slice 34/67.0]
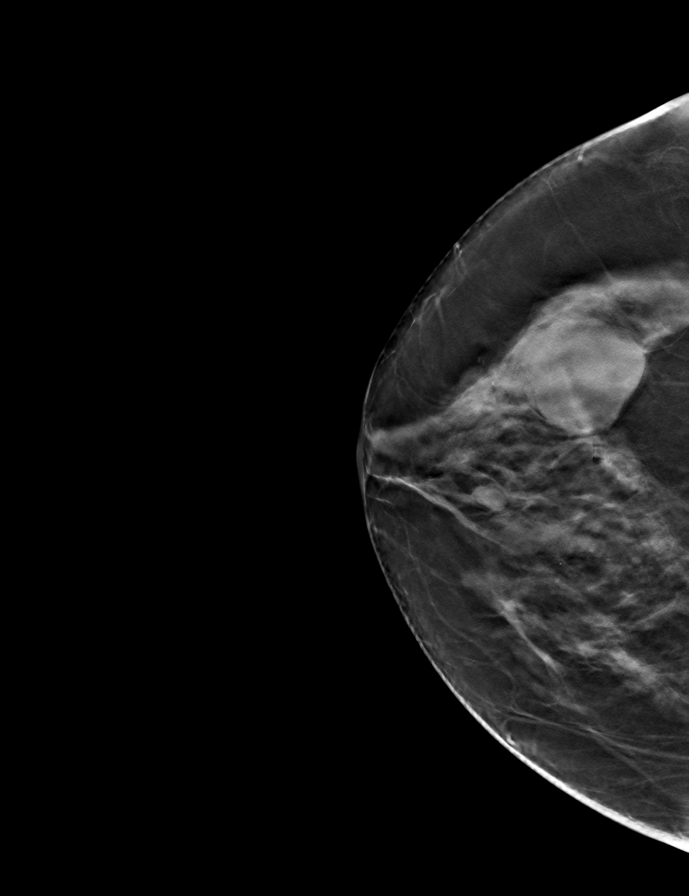

[8 of 27 positions shown; findings below may reference images not displayed]

ACR Breast Density Category c: The breast tissue is heterogeneously
dense, which may obscure small masses.
FINDINGS: A ribbon shaped biopsy marking clip is seen at the site biopsy in
the superior central right breast. There are 2 or 3 residual
calcifications at the biopsy site. In the same region of the
upper-outer quadrant of the right breast there is a circumscribed
oval mass which has increased in size from the prior mammogram, and
then there's a possible additional indistinct mass just anterior to
this larger mask, and closer to the nipple. No other suspicious
calcifications, masses or areas of distortion are seen in the
bilateral breasts.

Mammographic images were processed with CAD.

Ultrasound of the right breast at 10 o'clock, 3 cm from the nipple
demonstrates an oval anechoic circumscribed mass measuring 2.9 x
x 2.8 cm, consistent with a benign cyst.

Slightly closer to the nipple there is an irregular hypoechoic mass
with indistinct margins at [DATE], 2 cm from the nipple measuring
x 0.7 x 1.2 cm.

Ultrasound of the right axilla demonstrates multiple
normal-appearing lymph nodes.
IMPRESSION: 1. No suspicious changes are identified at the site of biopsied
atypical lobular hyperplasia in the upper-outer quadrant of the
right breast.

2. There is an indeterminate 1.2 cm mass in the right breast at
[DATE].

3.  No evidence of right axillary lymphadenopathy.

4.  No evidence of malignancy in the left breast.

RECOMMENDATION:
1. Ultrasound-guided biopsy is recommended for the right breast mass
at [DATE].

2. The patient was counseled on the possible, but low likelihood
(less than 10%) of upstaging if the biopsied atypical lobular
hyperplasia in the right breast were surgically excised, and also
the low but increased risk of developing breast cancer without
excision.

I have discussed the findings and recommendations with the patient.
Results were also provided in writing at the conclusion of the
visit. If applicable, a reminder letter will be sent to the patient
regarding the next appointment.

BI-RADS CATEGORY  4: Suspicious.

## 2019-12-24 IMAGING — MG US BREAST BX W LOC DEV 1ST LESION IMG BX SPEC US GUIDE*R*
1 series · 8 of 8 positions shown · non-contrast
Comparison: Previous exam(s).

Addendum:
CLINICAL DATA: Patient presents for ultrasound-guided core needle
biopsy of a indeterminate mass over the [DATE] position of the right
breast 2 cm from the nipple. History of biopsy-proven ALH right
breast February 2017 without excision.

EXAM:
ULTRASOUND GUIDED RIGHT BREAST CORE NEEDLE BIOPSY

[Series 1: MG view · 0.06mm/px · 8 of 23 slices shown]
[im 1/23]
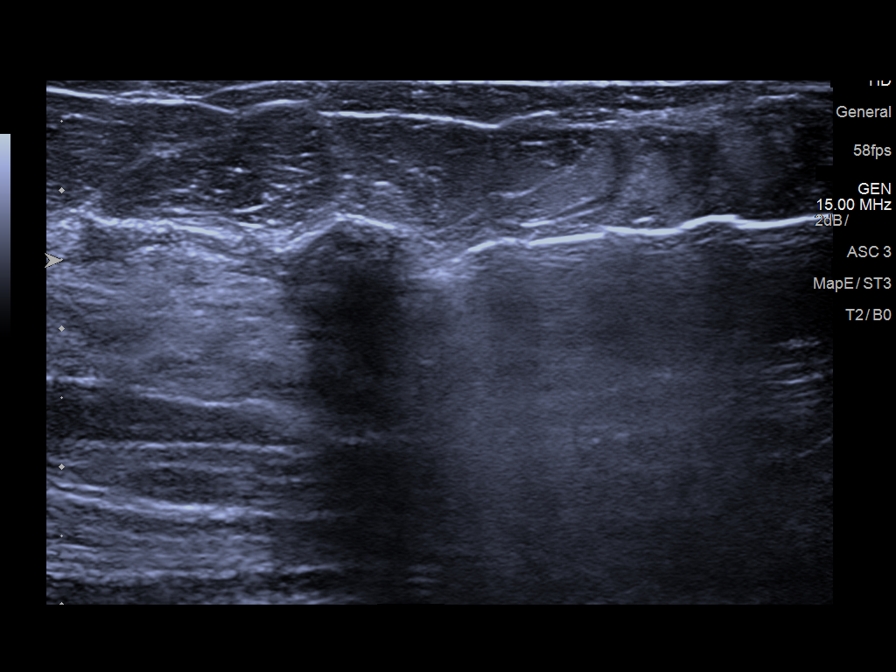
[im 4/23]
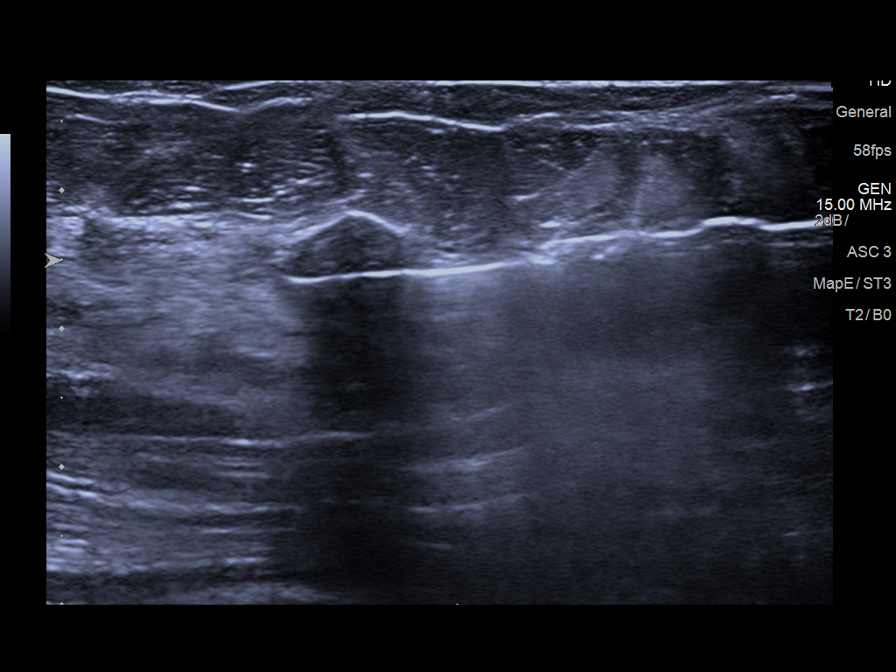
[im 7/23]
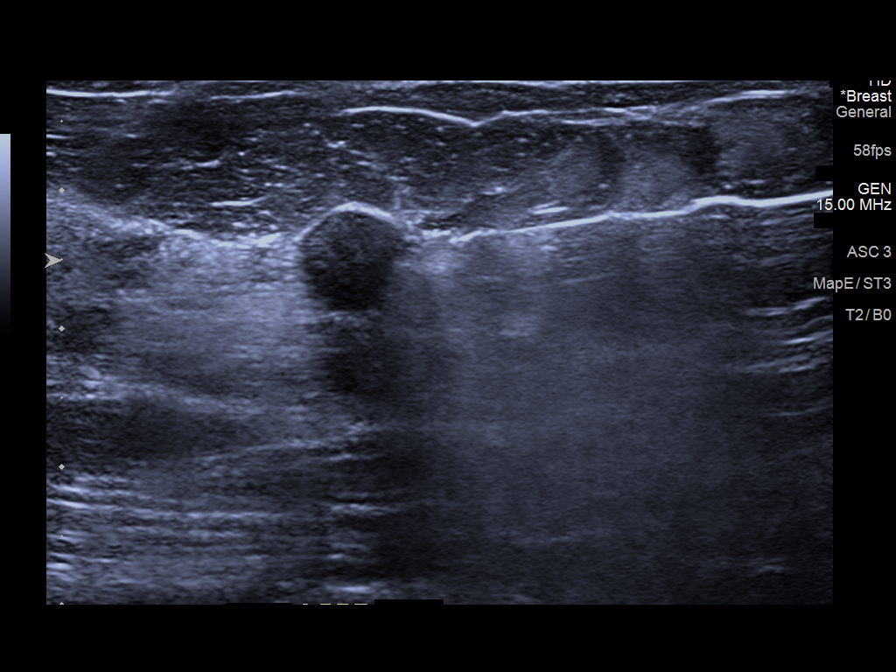
[im 10/23]
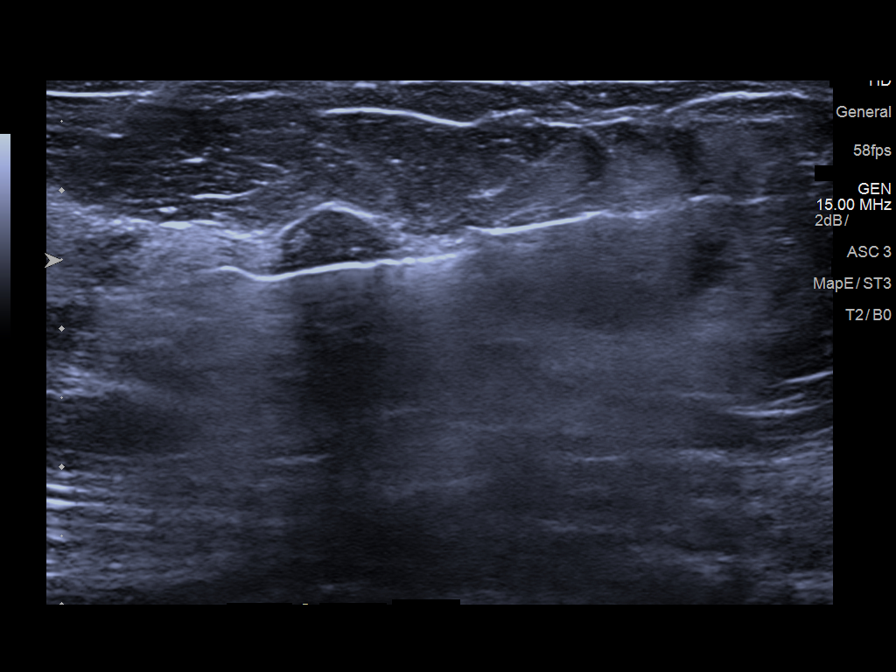
[im 13/23]
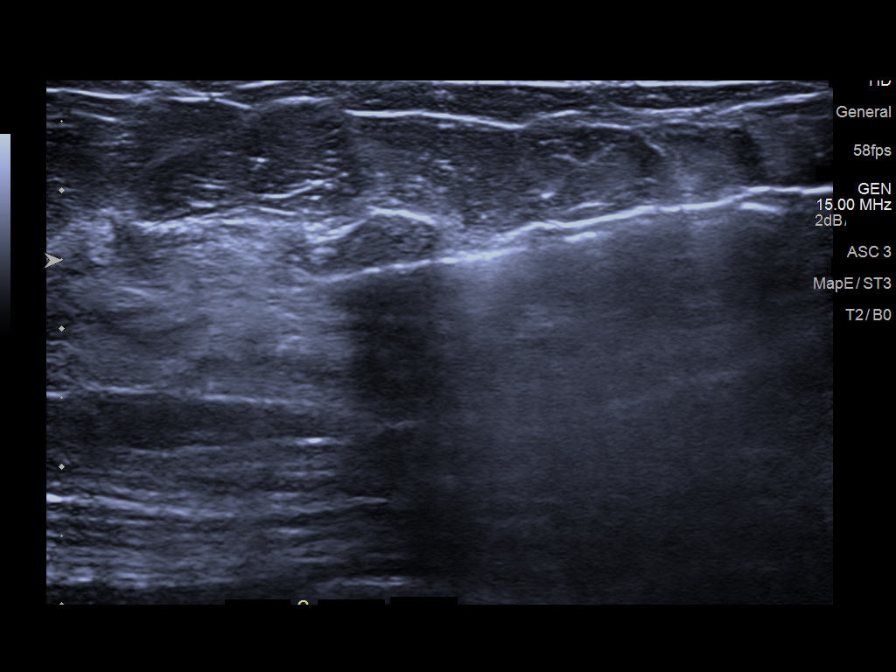
[im 16/23]
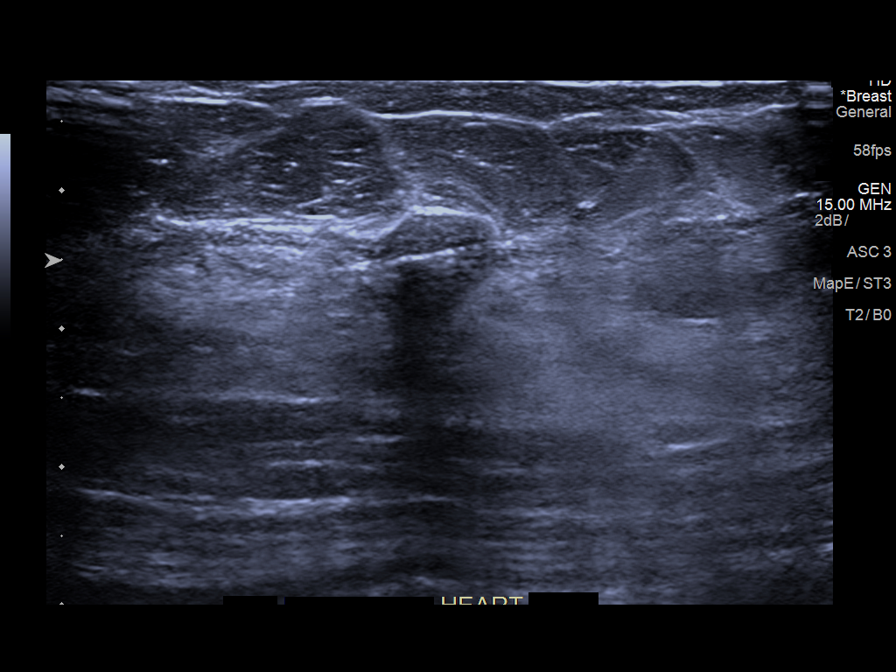
[im 19/23]
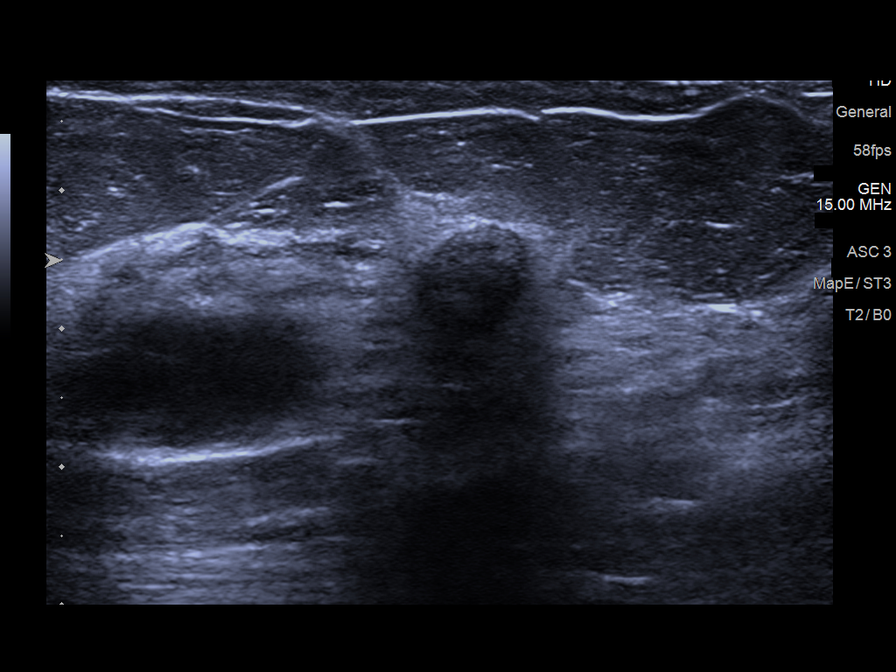
[im 23/23]
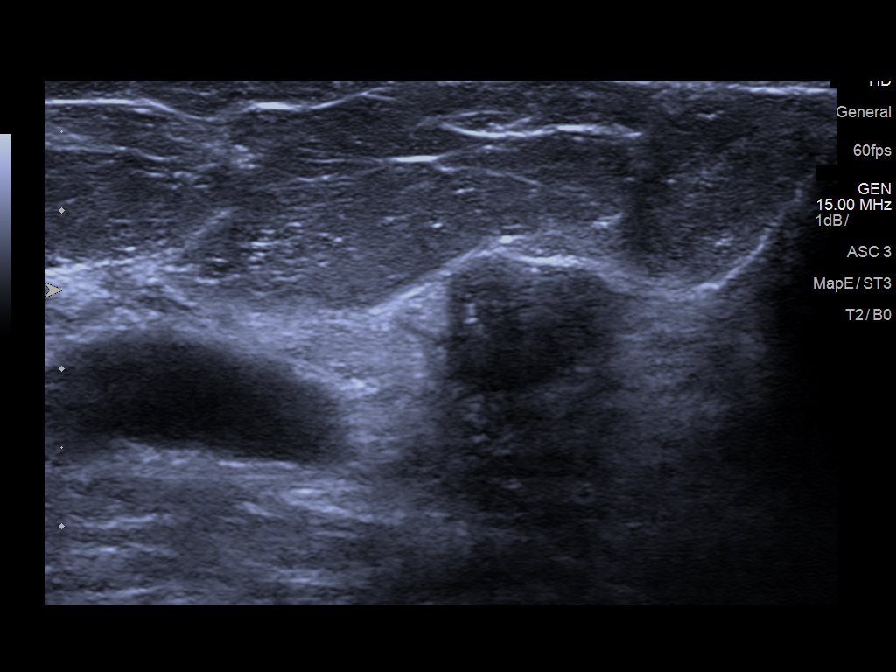

[8 of 8 positions shown; findings below may reference images not displayed]



Lesion quadrant: Right upper outer quadrant.

Using sterile technique and 1% Lidocaine as local anesthetic, under
direct ultrasound visualization, a 14 gauge Peluang device was
used to perform biopsy of the targeted mass at the [DATE] position
using a inferior to superior approach. Three adequate tissue
specimens were obtained without complications. At the conclusion of
the procedure a heart shaped tissue marker clip was deployed into
the biopsy cavity. Follow up 2 view mammogram was performed and
dictated separately.
IMPRESSION: Ultrasound guided biopsy of an indeterminate right breast mass. No
apparent complications.

ADDENDUM:
Pathology of the right breast biopsy revealed A. BREAST, RIGHT [DATE]
[id number]; ULTRASOUND-GUIDED BIOPSY: ORGANIZING FAT NECROSIS WITH
FOREIGN BODY GIANT CELL REACTION AND ADJACENT STROMAL FIBROSIS.
NEGATIVE FOR ATYPIA AND MALIGNANCY.

This was found to be concordant by Dr. Dil.

Recommendation: Annual diagnostic mammogram in one year.

At the patient's request, results and recommendations were relayed
to the patient by phone by Provost, Adelfa on 08/05/18. The
patient stated she did well following the biopsy with no bleeding,
or pain. Post biopsy instructions were reviewed with the patient and
all of her questions were answered. She was encouraged to contact
the [HOSPITAL] with any further questions or concerns.

Addendum by Provost, Adelfa on 08/06/18.

*** End of Addendum ***

## 2019-12-24 IMAGING — MG MM BREAST LOCALIZATION CLIP
2 series · 2 of 2 positions shown · non-contrast
Comparison: Previous exam(s).

CLINICAL DATA: Patient is post ultrasound-guided core needle biopsy
of an indeterminate mass at the [DATE] position of the right breast.

EXAM:
DIAGNOSTIC right MAMMOGRAM POST ultrasound BIOPSY

[R ML]
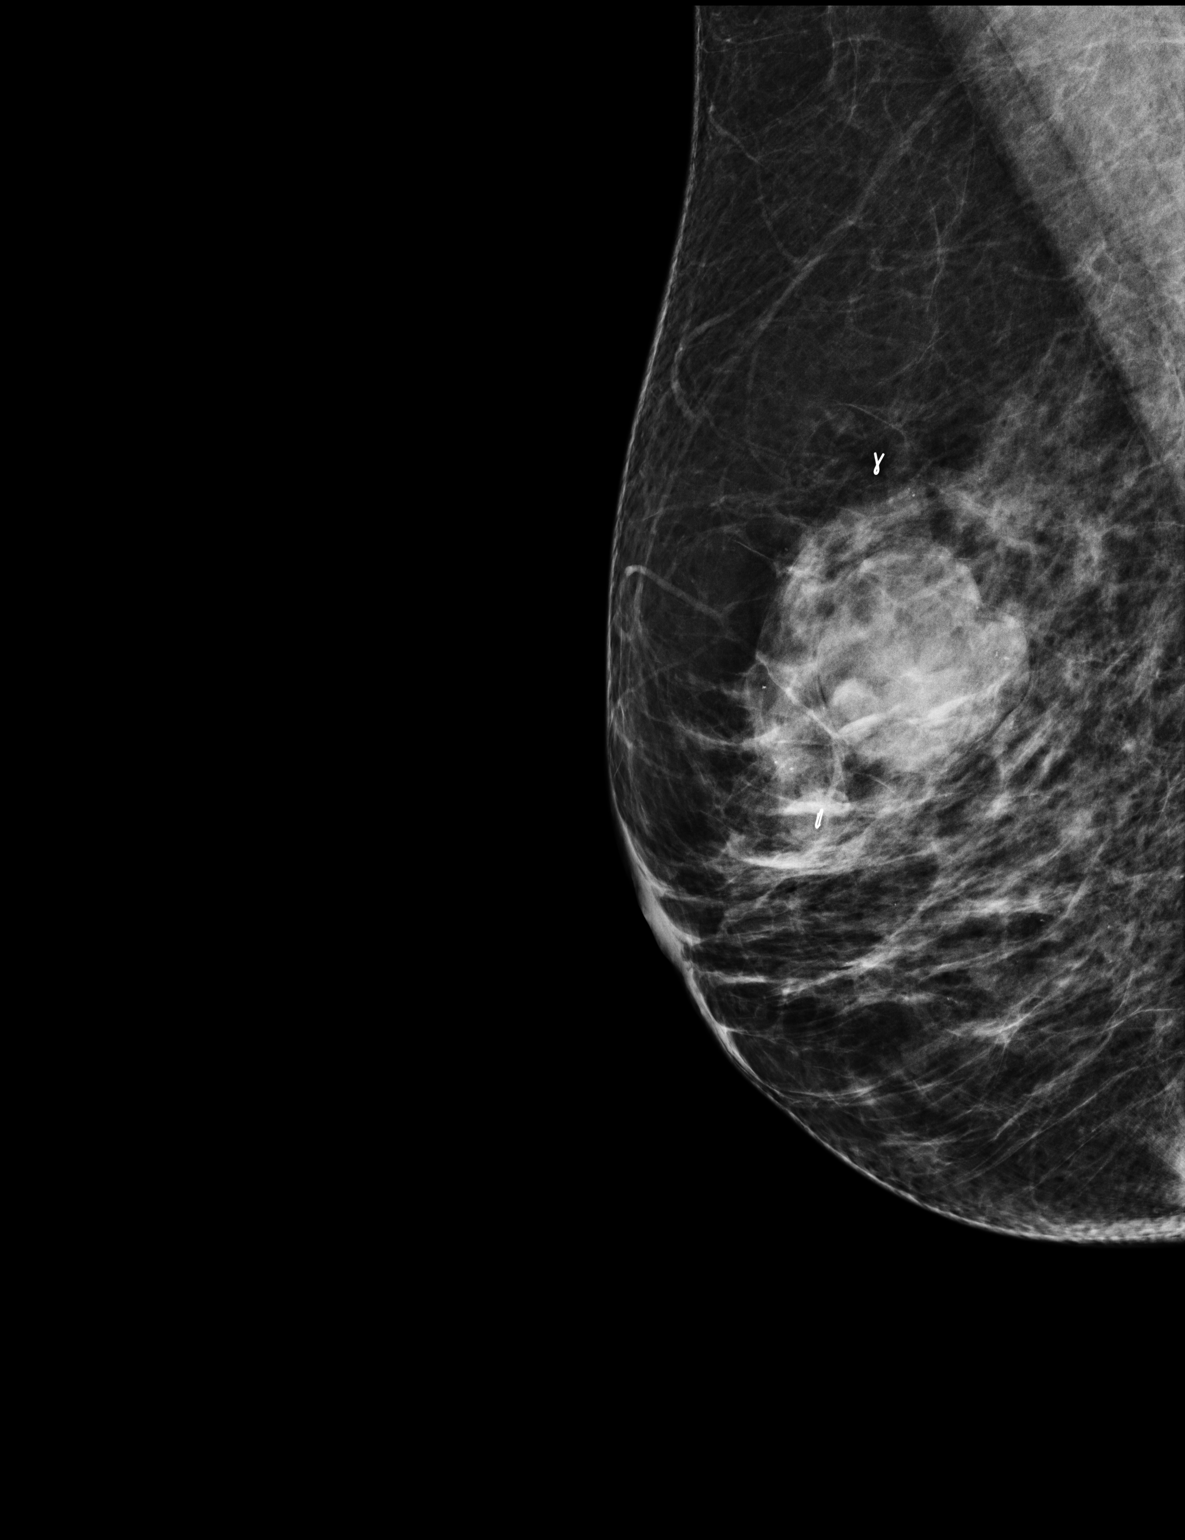

[R CC]
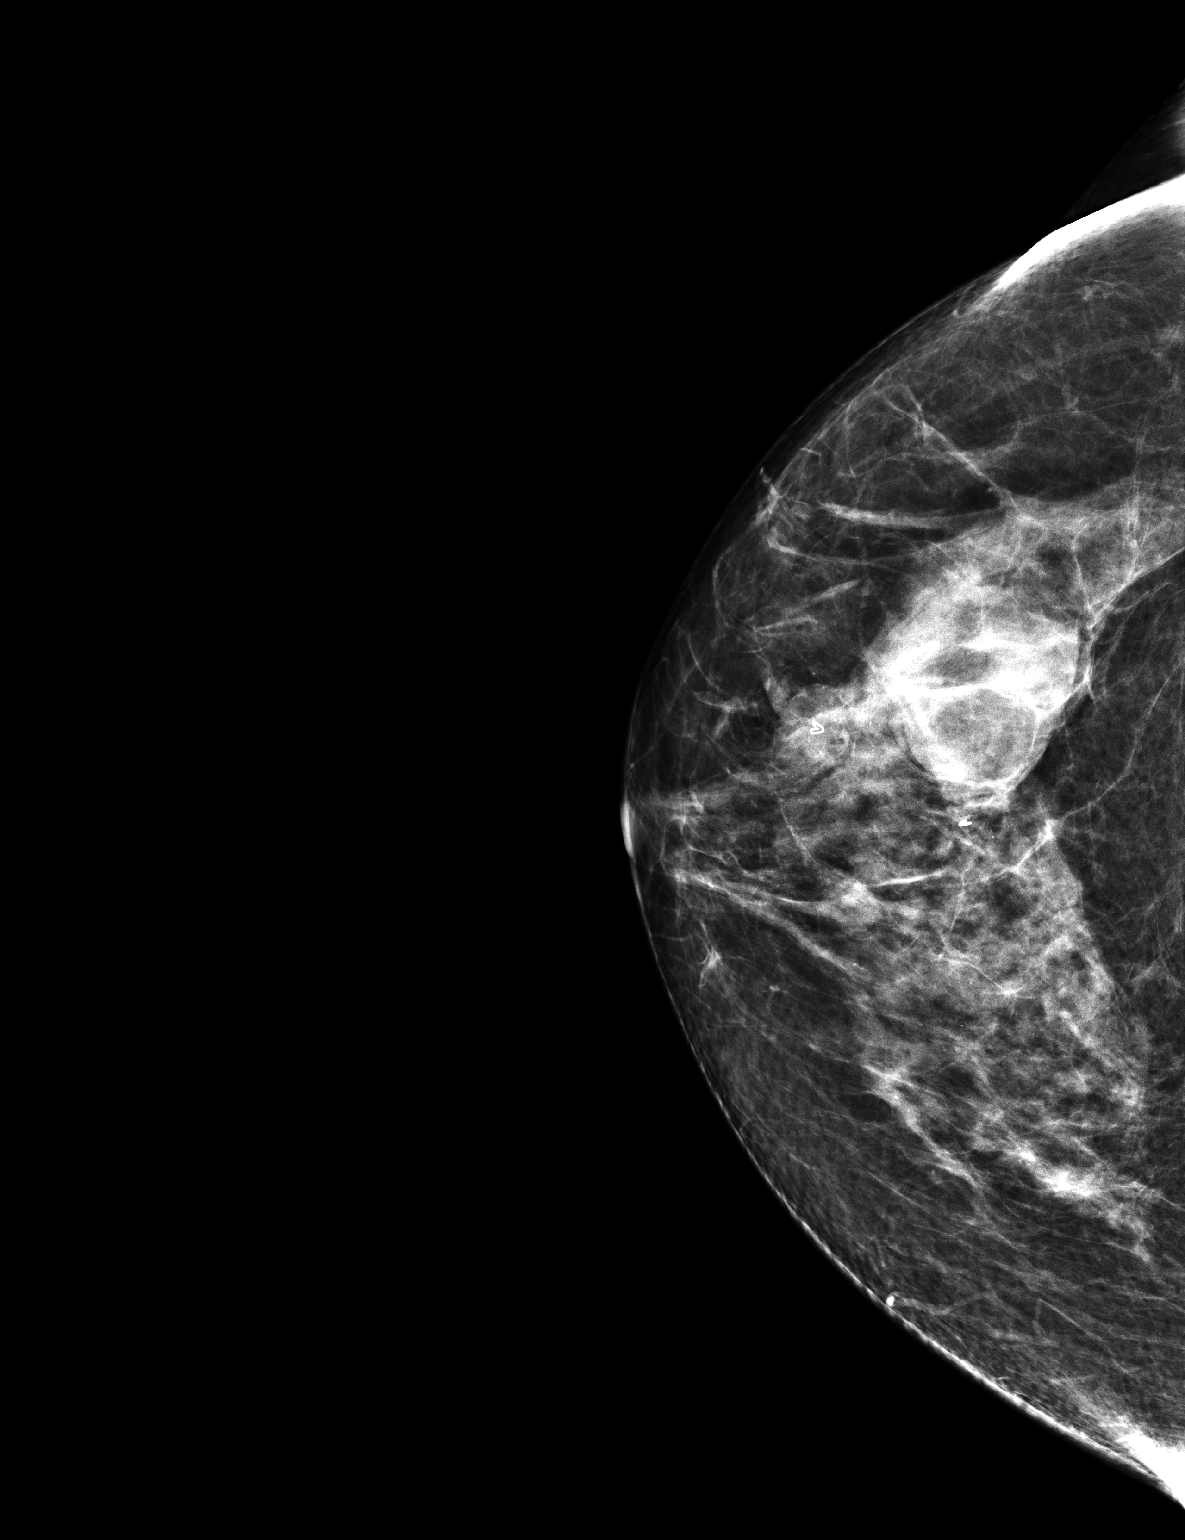

[2 of 2 positions shown; findings below may reference images not displayed]

FINDINGS: Mammographic images were obtained following ultrasound guided biopsy
of the targeted mass at the [DATE] position. Images demonstrates
satisfactory placement of a heart shaped metallic clip over the
biopsied mass at the [DATE] position.
IMPRESSION: Satisfactory clip placement post ultrasound-guided core biopsy
indeterminate right breast mass.

Final Assessment: Post Procedure Mammograms for Marker Placement

## 2020-01-14 DIAGNOSIS — H16002 Unspecified corneal ulcer, left eye: Secondary | ICD-10-CM | POA: Diagnosis not present

## 2020-03-09 DIAGNOSIS — H5213 Myopia, bilateral: Secondary | ICD-10-CM | POA: Diagnosis not present

## 2020-07-11 MED FILL — VENLAFAXINE HCL 75 MG TAB: 75 | 90 days supply | Qty: 90 | Fill #0

## 2020-08-24 ENCOUNTER — Ambulatory Visit: Payer: 59 | Admitting: Obstetrics and Gynecology

## 2020-09-19 ENCOUNTER — Ambulatory Visit: Payer: 59 | Admitting: Obstetrics and Gynecology

## 2020-09-19 ENCOUNTER — Telehealth: Payer: Self-pay | Admitting: Cardiovascular Disease

## 2020-09-19 NOTE — Telephone Encounter (Signed)
3 attempts to schedule fu appt from recall list.   Deleting recall.   

## 2020-10-14 ENCOUNTER — Other Ambulatory Visit: Payer: Self-pay | Admitting: Obstetrics and Gynecology

## 2020-10-14 DIAGNOSIS — N951 Menopausal and female climacteric states: Secondary | ICD-10-CM

## 2020-10-14 MED FILL — VENLAFAXINE HCL 75 MG TAB: 75 | 90 days supply | Qty: 90 | Fill #0

## 2020-10-17 ENCOUNTER — Ambulatory Visit: Payer: 59 | Admitting: Obstetrics and Gynecology

## 2020-11-08 ENCOUNTER — Ambulatory Visit: Payer: 59 | Admitting: Obstetrics and Gynecology

## 2020-11-10 ENCOUNTER — Other Ambulatory Visit (HOSPITAL_BASED_OUTPATIENT_CLINIC_OR_DEPARTMENT_OTHER): Payer: Self-pay

## 2020-12-12 DIAGNOSIS — R070 Pain in throat: Secondary | ICD-10-CM | POA: Diagnosis not present

## 2020-12-12 DIAGNOSIS — I889 Nonspecific lymphadenitis, unspecified: Secondary | ICD-10-CM | POA: Diagnosis not present

## 2020-12-12 DIAGNOSIS — J0110 Acute frontal sinusitis, unspecified: Secondary | ICD-10-CM | POA: Diagnosis not present

## 2020-12-12 DIAGNOSIS — R0982 Postnasal drip: Secondary | ICD-10-CM | POA: Diagnosis not present

## 2020-12-27 ENCOUNTER — Ambulatory Visit (INDEPENDENT_AMBULATORY_CARE_PROVIDER_SITE_OTHER): Payer: 59 | Admitting: Obstetrics and Gynecology

## 2020-12-27 ENCOUNTER — Other Ambulatory Visit: Payer: Self-pay

## 2020-12-27 ENCOUNTER — Encounter: Payer: Self-pay | Admitting: Obstetrics and Gynecology

## 2020-12-27 VITALS — BP 120/90 | Ht 63.0 in | Wt 144.0 lb

## 2020-12-27 DIAGNOSIS — Z1211 Encounter for screening for malignant neoplasm of colon: Secondary | ICD-10-CM | POA: Diagnosis not present

## 2020-12-27 DIAGNOSIS — Z1231 Encounter for screening mammogram for malignant neoplasm of breast: Secondary | ICD-10-CM

## 2020-12-27 DIAGNOSIS — Z01419 Encounter for gynecological examination (general) (routine) without abnormal findings: Secondary | ICD-10-CM | POA: Diagnosis not present

## 2020-12-27 DIAGNOSIS — Z0000 Encounter for general adult medical examination without abnormal findings: Secondary | ICD-10-CM

## 2020-12-27 DIAGNOSIS — R928 Other abnormal and inconclusive findings on diagnostic imaging of breast: Secondary | ICD-10-CM

## 2020-12-27 DIAGNOSIS — Z13220 Encounter for screening for lipoid disorders: Secondary | ICD-10-CM

## 2020-12-27 DIAGNOSIS — N951 Menopausal and female climacteric states: Secondary | ICD-10-CM | POA: Diagnosis not present

## 2020-12-27 MED ORDER — VENLAFAXINE HCL 75 MG PO TABS: 75.0000 mg | ORAL_TABLET | Freq: Every day | ORAL | 3 refills | Status: AC

## 2020-12-27 NOTE — Progress Notes (Signed)
PCP: Wellington Hampshire, MD   Chief Complaint  Patient presents with  . Gynecologic Exam    No concerns    HPI:      Ms. Catherine Ramos is a 60 y.o. G0P0000 who LMP was No LMP recorded. Patient is postmenopausal., presents today for her annual examination.  Her menses are absent due to menopause.  She does not have PMB.  She does have vasomotor sx and uses effexor with sx relief. Winter months are better than hotter months. Needs Rx RF.  Sex activity: single partner, contraception - post menopausal status. She does have vaginal dryness, improved with lubricants.   Last Pap: 08/12/19  Results were: no abnormalities /neg HPV DNA.  Hx of STDs: none  Last mammogram:  10/16/19 Results were normal, repeat in 12 months. Pt has moved to [place], [place] area and will sched there 07/29/18  Cat 4 RT breast, with fat necrosis on bx 12/19;  2018 Hx of RT breast ATYPICAL LOBULAR HYPERPLASIA (ALH) ASSOCIATED WITH A FEW VERY SMALL CALCIFICATIONS, SEE COMMENT. COLUMNAR CELL CHANGE ASSOCIATED WITH CALCIFICATIONS. NEGATIVE FOR CARCINOMA.  There is no FH of breast cancer. There is a FH of ovarian cancer in her PGM. Pt is MyRisk neg 2016. The patient does do self-breast exams.  Colonoscopy: Never. Neg cologard 2019, repeat due after 3 yrs. Would like to do it again.  Tobacco use: The patient denies current or previous tobacco use. Alcohol use: none  No drug use. Exercise: moderately active  She does get adequate calcium and Vitamin D in her diet.  Didn't do fasting labs last yr. Will do this yr.   Past Medical History:  Diagnosis Date  . 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 BRCA negative 08/2014   MyRisk neg; IBIS=10%  . Family history of ovarian cancer   . Heart murmur   . Screening for colon cancer 07/2018   neg Cologuard; repeat due in 3 yrs  . Vasomotor symptoms due to menopause     Past Surgical History:  Procedure Laterality Date  . BREAST BIOPSY Left 2016    neg  . BREAST BIOPSY Right 03/27/2017   ALH  . BREAST BIOPSY Right 08/06/2018   ORGANIZING FAT NECROSIS WITH FOREIGN BODY GIANT CELL REACTION AND     Family History  Problem Relation Age of Onset  . Ovarian cancer Paternal Grandmother 17  . Breast cancer Neg Hx     Social History   Socioeconomic History  . Marital status: Married    Spouse name: Not on file  . Number of children: Not on file  . Years of education: Not on file  . Highest education level: Not on file  Occupational History  . Occupation: Dentist: [employer]  Tobacco Use  . Smoking status: Never Smoker  . Smokeless tobacco: Never Used  Vaping Use  . Vaping Use: Never used  Substance and Sexual Activity  . Alcohol use: No    Comment: rare  . Drug use: No  . Sexual activity: Yes    Birth control/protection: Post-menopausal  Other Topics Concern  . Not on file  Social History Narrative  . Not on file   Social Determinants of Health   Financial Resource Strain: Not on file  Food Insecurity: Not on file  Transportation Needs: Not on file  Physical Activity: Not on file  Stress: Not on file  Social Connections: Not on file  Intimate Partner Violence: Not  on file    Current Meds  Medication Sig  . Calcium Carbonate-Vitamin D 600-200 MG-UNIT TABS Take by mouth.  . fexofenadine (ALLEGRA) 180 MG tablet Take by mouth.  . [DISCONTINUED] venlafaxine (EFFEXOR) 75 MG tablet TAKE 1 TABLET BY MOUTH ONCE A DAY      ROS:  Review of Systems  Constitutional: Negative for fatigue, fever and unexpected weight change.  Respiratory: Negative for cough, shortness of breath and wheezing.   Cardiovascular: Negative for chest pain, palpitations and leg swelling.  Gastrointestinal: Negative for blood in stool, constipation, diarrhea, nausea and vomiting.  Endocrine: Negative for cold intolerance, heat intolerance and polyuria.  Genitourinary: Negative for dyspareunia, dysuria, flank pain, frequency,  genital sores, hematuria, menstrual problem, pelvic pain, urgency, vaginal bleeding, vaginal discharge and vaginal pain.  Musculoskeletal: Negative for back pain, joint swelling and myalgias.  Skin: Negative for rash.  Neurological: Negative for dizziness, syncope, light-headedness, numbness and headaches.  Hematological: Negative for adenopathy.  Psychiatric/Behavioral: Negative for agitation, confusion, sleep disturbance and suicidal ideas. The patient is not nervous/anxious.      Objective: BP 120/90   Ht 5' 3"  (1.6 m)   Wt 144 lb (65.3 kg)   BMI 25.51 kg/m    Physical Exam Constitutional:      Appearance: She is well-developed.  Genitourinary:     Vulva and rectum normal.     Right Labia: No rash, tenderness or lesions.    Left Labia: No tenderness, lesions or rash.    No vaginal discharge, erythema or tenderness.      Right Adnexa: not tender and no mass present.    Left Adnexa: not tender and no mass present.    No cervical motion tenderness, friability or polyp.     Uterus is not enlarged or tender.  Rectum:     Guaiac result negative.     No rectal mass, anal fissure or tenderness.  Breasts:     Right: No mass, nipple discharge, skin change or tenderness.     Left: No mass, nipple discharge, skin change or tenderness.    Neck:     Thyroid: No thyromegaly.  Cardiovascular:     Rate and Rhythm: Normal rate and regular rhythm.     Heart sounds: Murmur heard.   Systolic murmur is present with a grade of 4/6.   Pulmonary:     Effort: Pulmonary effort is normal.     Breath sounds: Normal breath sounds.  Abdominal:     Palpations: Abdomen is soft.     Tenderness: There is no abdominal tenderness. There is no guarding or rebound.  Musculoskeletal:        General: Normal range of motion.     Cervical back: Normal range of motion.  Lymphadenopathy:     Cervical: No cervical adenopathy.  Neurological:     General: No focal deficit present.     Mental Status:  She is alert and oriented to person, place, and time.     Cranial Nerves: No cranial nerve deficit.  Skin:    General: Skin is warm and dry.  Psychiatric:        Mood and Affect: Mood normal.        Behavior: Behavior normal.        Thought Content: Thought content normal.        Judgment: Judgment normal.  Vitals reviewed.     Assessment/Plan:  Encounter for annual routine gynecological examination  Encounter for screening mammogram for malignant neoplasm of breast -  Plan: MM 3D SCREEN BREAST BILATERAL; pt to sched closer to home. Will do ref prn.  Abnormal mammogram - Plan: MM 3D SCREEN BREAST BILATERAL  Vasomotor symptoms due to menopause - Plan: venlafaxine (EFFEXOR) 75 MG tablet; Rx RF effexor  Screening for colon cancer - Plan: Cologuard; colonoscopy/cologuard discussed. Pt elects cologuard. Ref sent. Will f/u with results.  Blood tests for routine general physical examination - Plan: Comprehensive metabolic panel, Lipid panel, Lipid panel, Comprehensive metabolic panel  Screening cholesterol level - Plan: Lipid panel, Lipid panel    Meds ordered this encounter  Medications  . venlafaxine (EFFEXOR) 75 MG tablet    Sig: Take 1 tablet (75 mg total) by mouth daily.    Dispense:  90 tablet    Refill:  3    Order Specific Question:   Supervising Provider    Answer:   Gae Dry [[id number]]          GYN counsel breast self exam, mammography screening, menopause, adequate intake of calcium and vitamin D, diet and exercise    F/U  Return in about 1 year (around 12/27/2021).  Catherine Ramos B. Vail Basista, PA-C 12/27/2020 4:34 PM

## 2020-12-27 NOTE — Patient Instructions (Addendum)
I value your feedback and you entrusting us with your care. If you get a [facility] patient survey, I would appreciate you taking the time to let us know about your experience today. Thank you!  [facility]: [phone]      

## 2021-01-21 LAB — EXTERNAL GENERIC LAB PROCEDURE

## 2021-01-24 NOTE — Telephone Encounter (Signed)
Received fax from [facility], pt's insurance is not paying test because its been less than 3 yrs since last one (07/28/18). Notes in their system say they tried to contact pt a few days ago to let her know, no answer, so they left voice msg and still have not heard from her.

## 2021-01-24 NOTE — Telephone Encounter (Signed)
Will put new order in 12/21.

## 2021-01-24 NOTE — Telephone Encounter (Signed)
Pls let pt know I'll put new order in 12/21 to do it after 3 yrs. Thx.

## 2021-01-24 NOTE — Telephone Encounter (Signed)
Per [facility] called [facility] to ask if they can keep order until insurance pays. Was advised by rep that when they received the stool sample, not enough stool was collected and reached out pt to let her know so, plus the insurance issue, and pt denied to do test since ins would not cover. She will wait until December for test coverage.

## 2021-03-13 ENCOUNTER — Telehealth: Payer: Self-pay

## 2021-03-13 NOTE — Telephone Encounter (Signed)
Called pt, no answer, [facility]. 

## 2021-03-13 NOTE — Telephone Encounter (Signed)
Pt called back. She said she called [facility] and was told she needed a new order in order for them to send a new kit? Called [facility] and rep told me previous rep gave pt unclear info. All they needed was for [place] to call them to give a verbal order for kit to be sent. Someone will contact pt before sending new kit out. Pt aware.

## 2021-03-13 NOTE — Telephone Encounter (Signed)
Pls look into this. Thx.

## 2021-03-13 NOTE — Telephone Encounter (Signed)
Pt returned phone call, needed [facility] [phone] #. Given to her.

## 2021-03-13 NOTE — Telephone Encounter (Signed)
Pt calling; cologard was abnormal; they wanted her to repeat the test; has not recv'd 2nd box.  [phone] or direct line [phone]

## 2021-03-16 DIAGNOSIS — J309 Allergic rhinitis, unspecified: Secondary | ICD-10-CM | POA: Diagnosis not present

## 2021-03-16 DIAGNOSIS — N951 Menopausal and female climacteric states: Secondary | ICD-10-CM | POA: Diagnosis not present

## 2021-03-16 DIAGNOSIS — M779 Enthesopathy, unspecified: Secondary | ICD-10-CM | POA: Diagnosis not present

## 2021-03-16 DIAGNOSIS — Z6826 Body mass index (BMI) 26.0-26.9, adult: Secondary | ICD-10-CM | POA: Diagnosis not present

## 2021-03-16 DIAGNOSIS — R29898 Other symptoms and signs involving the musculoskeletal system: Secondary | ICD-10-CM | POA: Diagnosis not present

## 2021-03-27 DIAGNOSIS — N951 Menopausal and female climacteric states: Secondary | ICD-10-CM | POA: Diagnosis not present

## 2021-03-27 DIAGNOSIS — M792 Neuralgia and neuritis, unspecified: Secondary | ICD-10-CM | POA: Diagnosis not present

## 2021-03-27 DIAGNOSIS — J309 Allergic rhinitis, unspecified: Secondary | ICD-10-CM | POA: Diagnosis not present

## 2021-03-27 DIAGNOSIS — R7303 Prediabetes: Secondary | ICD-10-CM | POA: Diagnosis not present

## 2021-03-27 DIAGNOSIS — Z6825 Body mass index (BMI) 25.0-25.9, adult: Secondary | ICD-10-CM | POA: Diagnosis not present

## 2021-05-16 DIAGNOSIS — L255 Unspecified contact dermatitis due to plants, except food: Secondary | ICD-10-CM | POA: Diagnosis not present

## 2021-05-16 DIAGNOSIS — L539 Erythematous condition, unspecified: Secondary | ICD-10-CM | POA: Diagnosis not present

## 2021-06-23 DIAGNOSIS — H6592 Unspecified nonsuppurative otitis media, left ear: Secondary | ICD-10-CM | POA: Diagnosis not present

## 2021-06-23 DIAGNOSIS — R030 Elevated blood-pressure reading, without diagnosis of hypertension: Secondary | ICD-10-CM | POA: Diagnosis not present

## 2021-06-23 DIAGNOSIS — H60501 Unspecified acute noninfective otitis externa, right ear: Secondary | ICD-10-CM | POA: Diagnosis not present

## 2021-07-20 ENCOUNTER — Other Ambulatory Visit: Payer: Self-pay | Admitting: Obstetrics and Gynecology

## 2021-07-20 DIAGNOSIS — Z1211 Encounter for screening for malignant neoplasm of colon: Secondary | ICD-10-CM

## 2021-07-20 NOTE — Progress Notes (Signed)
Called pt, no answer, LVMTR[facility]. 

## 2021-07-20 NOTE — Progress Notes (Signed)
Cologuard order. Last one done 12/19.

## 2021-07-20 NOTE — Progress Notes (Signed)
Pt aware and says she is now living in [place] so she's in the process of a new GYN.

## 2021-08-03 DIAGNOSIS — H6123 Impacted cerumen, bilateral: Secondary | ICD-10-CM | POA: Diagnosis not present

## 2021-08-03 DIAGNOSIS — H90A22 Sensorineural hearing loss, unilateral, left ear, with restricted hearing on the contralateral side: Secondary | ICD-10-CM | POA: Diagnosis not present

## 2021-08-10 DIAGNOSIS — Z1231 Encounter for screening mammogram for malignant neoplasm of breast: Secondary | ICD-10-CM | POA: Diagnosis not present

## 2021-08-23 DIAGNOSIS — H6063 Unspecified chronic otitis externa, bilateral: Secondary | ICD-10-CM | POA: Diagnosis not present

## 2021-09-14 DIAGNOSIS — Z1211 Encounter for screening for malignant neoplasm of colon: Secondary | ICD-10-CM | POA: Diagnosis not present

## 2021-09-22 LAB — COLOGUARD: COLOGUARD: NEGATIVE

## 2022-01-18 DIAGNOSIS — Z0001 Encounter for general adult medical examination with abnormal findings: Secondary | ICD-10-CM | POA: Diagnosis not present
